# Patient Record
Sex: Female | Born: 1945 | Race: Black or African American | Hispanic: No | Marital: Married | Smoking: Never smoker
Health system: Southern US, Community
[De-identification: ages and names within clinical notes are randomized; demographics above are authoritative.]

---

## 2008-04-05 ENCOUNTER — Encounter: Admission: RE | Admit: 2008-04-05 | Discharge: 2008-04-05 | Payer: Self-pay | Admitting: Family Medicine

## 2008-04-05 ENCOUNTER — Ambulatory Visit: Payer: Self-pay | Admitting: Family Medicine

## 2008-04-05 DIAGNOSIS — E049 Nontoxic goiter, unspecified: Secondary | ICD-10-CM | POA: Insufficient documentation

## 2008-04-08 ENCOUNTER — Encounter: Payer: Self-pay | Admitting: Family Medicine

## 2008-04-11 DIAGNOSIS — E785 Hyperlipidemia, unspecified: Secondary | ICD-10-CM | POA: Insufficient documentation

## 2008-04-11 LAB — CONVERTED CEMR LAB
ALT: 10 units/L (ref 0–35)
AST: 16 units/L (ref 0–37)
Albumin: 4 g/dL (ref 3.5–5.2)
Alkaline Phosphatase: 97 units/L (ref 39–117)
BUN: 16 mg/dL (ref 6–23)
CO2: 26 meq/L (ref 19–32)
Calcium: 9.3 mg/dL (ref 8.4–10.5)
Chloride: 105 meq/L (ref 96–112)
Cholesterol, target level: 200 mg/dL
Cholesterol: 236 mg/dL — ABNORMAL HIGH (ref 0–200)
Creatinine, Ser: 1.07 mg/dL (ref 0.40–1.20)
Glucose, Bld: 98 mg/dL (ref 70–99)
HDL goal, serum: 40 mg/dL
HDL: 70 mg/dL (ref >39)
LDL Cholesterol: 152 mg/dL — ABNORMAL HIGH (ref 0–99)
LDL Goal: 160 mg/dL
Potassium: 4.5 meq/L (ref 3.5–5.3)
Sodium: 141 meq/L (ref 135–145)
TSH: 1.105 microintl units/mL (ref 0.350–4.50)
Total Bilirubin: 0.5 mg/dL (ref 0.3–1.2)
Total CHOL/HDL Ratio: 3.4
Total Protein: 7.1 g/dL (ref 6.0–8.3)
Triglycerides: 69 mg/dL (ref <150)
VLDL: 14 mg/dL (ref 0–40)

## 2008-04-12 ENCOUNTER — Ambulatory Visit: Payer: Self-pay | Admitting: Family Medicine

## 2008-04-12 DIAGNOSIS — I10 Essential (primary) hypertension: Secondary | ICD-10-CM

## 2008-05-11 ENCOUNTER — Ambulatory Visit: Payer: Self-pay | Admitting: Family Medicine

## 2008-06-21 ENCOUNTER — Ambulatory Visit: Payer: Self-pay | Admitting: Family Medicine

## 2008-06-24 ENCOUNTER — Telehealth: Payer: Self-pay | Admitting: Family Medicine

## 2008-06-24 ENCOUNTER — Ambulatory Visit: Payer: Self-pay | Admitting: Family Medicine

## 2008-06-24 DIAGNOSIS — IMO0002 Reserved for concepts with insufficient information to code with codable children: Secondary | ICD-10-CM | POA: Insufficient documentation

## 2008-08-16 ENCOUNTER — Ambulatory Visit: Payer: Self-pay | Admitting: Family Medicine

## 2008-08-16 DIAGNOSIS — J309 Allergic rhinitis, unspecified: Secondary | ICD-10-CM | POA: Insufficient documentation

## 2008-09-02 ENCOUNTER — Encounter: Payer: Self-pay | Admitting: Family Medicine

## 2008-09-05 LAB — CONVERTED CEMR LAB: Direct LDL: 151 mg/dL — ABNORMAL HIGH

## 2008-10-27 ENCOUNTER — Ambulatory Visit: Payer: Self-pay | Admitting: Family Medicine

## 2008-11-04 ENCOUNTER — Encounter: Payer: Self-pay | Admitting: Family Medicine

## 2008-11-06 LAB — CONVERTED CEMR LAB
ALT: 13 units/L (ref 0–35)
AST: 17 units/L (ref 0–37)
Albumin: 4.1 g/dL (ref 3.5–5.2)
Alkaline Phosphatase: 87 units/L (ref 39–117)
BUN: 14 mg/dL (ref 6–23)
CO2: 27 meq/L (ref 19–32)
Calcium: 9.2 mg/dL (ref 8.4–10.5)
Chloride: 108 meq/L (ref 96–112)
Cholesterol: 178 mg/dL (ref 0–200)
Creatinine, Ser: 1.16 mg/dL (ref 0.40–1.20)
Glucose, Bld: 98 mg/dL (ref 70–99)
HDL: 69 mg/dL (ref >39)
LDL Cholesterol: 97 mg/dL (ref 0–99)
Potassium: 4.4 meq/L (ref 3.5–5.3)
Sodium: 143 meq/L (ref 135–145)
Total Bilirubin: 0.5 mg/dL (ref 0.3–1.2)
Total CHOL/HDL Ratio: 2.6
Total Protein: 6.9 g/dL (ref 6.0–8.3)
Triglycerides: 58 mg/dL (ref <150)
VLDL: 12 mg/dL (ref 0–40)

## 2009-05-11 ENCOUNTER — Ambulatory Visit: Payer: Self-pay | Admitting: Family Medicine

## 2009-05-11 DIAGNOSIS — M1712 Unilateral primary osteoarthritis, left knee: Secondary | ICD-10-CM | POA: Insufficient documentation

## 2009-05-11 DIAGNOSIS — N644 Mastodynia: Secondary | ICD-10-CM

## 2009-06-02 ENCOUNTER — Encounter: Admission: RE | Admit: 2009-06-02 | Discharge: 2009-06-02 | Payer: Self-pay | Admitting: Family Medicine

## 2010-02-07 ENCOUNTER — Ambulatory Visit: Payer: Self-pay | Admitting: Family Medicine

## 2010-02-07 ENCOUNTER — Encounter: Admission: RE | Admit: 2010-02-07 | Discharge: 2010-02-07 | Payer: Self-pay | Admitting: Family Medicine

## 2010-02-08 ENCOUNTER — Encounter: Payer: Self-pay | Admitting: Family Medicine

## 2010-02-09 LAB — CONVERTED CEMR LAB
ALT: 16 units/L (ref 0–35)
AST: 19 units/L (ref 0–37)
Albumin: 4.1 g/dL (ref 3.5–5.2)
Alkaline Phosphatase: 85 units/L (ref 39–117)
BUN: 15 mg/dL (ref 6–23)
CO2: 27 meq/L (ref 19–32)
Calcium: 9.2 mg/dL (ref 8.4–10.5)
Chloride: 108 meq/L (ref 96–112)
Cholesterol: 172 mg/dL (ref 0–200)
Creatinine, Ser: 1.02 mg/dL (ref 0.40–1.20)
Glucose, Bld: 96 mg/dL (ref 70–99)
HDL: 68 mg/dL (ref >39)
LDL Cholesterol: 95 mg/dL (ref 0–99)
Potassium: 4.4 meq/L (ref 3.5–5.3)
Sodium: 142 meq/L (ref 135–145)
TSH: 0.67 microintl units/mL (ref 0.350–4.500)
Total Bilirubin: 0.6 mg/dL (ref 0.3–1.2)
Total CHOL/HDL Ratio: 2.5
Total Protein: 6.4 g/dL (ref 6.0–8.3)
Triglycerides: 43 mg/dL (ref <150)
VLDL: 9 mg/dL (ref 0–40)

## 2010-02-15 ENCOUNTER — Ambulatory Visit: Payer: Self-pay | Admitting: Family Medicine

## 2010-02-15 DIAGNOSIS — K2990 Gastroduodenitis, unspecified, without bleeding: Secondary | ICD-10-CM

## 2010-02-15 DIAGNOSIS — K2970 Gastritis, unspecified, without bleeding: Secondary | ICD-10-CM | POA: Insufficient documentation

## 2010-06-05 NOTE — Assessment & Plan Note (Signed)
Summary: LEFT BREAST & Left knee pain   Vital Signs:  Patient profile:   65 year old female Height:      64 inches Weight:      213 pounds BMI:     36.69 O2 Sat:      100 % on Room air Temp:     97.9 degrees F oral Pulse rate:   68 / minute BP sitting:   127 / 84  (left arm) Cuff size:   large  Vitals Entered By: [name] CMA (May 11, 2009 9:48 AM)  O2 Flow:  Room air CC: Pain in L breast and L knee x 2 weeks.    Primary Care Provider:  [name]  CC:  Pain in L breast and L knee x 2 weeks. Rhonda Sampson  History of Present Illness: Pain in L breast and L knee x 2 weeks. Pain is sharp and tender over the nipple.  No lumps or bumps. Mammogram was last year. It was normal. Bra aggrevates the area. Pain intermittant.  No periods. No medicine or heating pad.  No alleviaiting sxs.   Left knee with occ sharp along the medial side of the knee.  Pain for one month in this area.  Comes and goes.  No swelling.  Using icey hot. Worse when up on it alot.  NO fever.  No medicine for this either.    Allergies: No Known Drug Allergies  Physical Exam  General:  Well-developed,well-nourished,in no acute distress; alert,appropriate and cooperative throughout examination Breasts:  Left breast appears slightly larger than the right but no edema, skin puckering or paeu d'orange. ON palpation no lesions or masses or tenderness.   Msk:  Left knee with some crepitus. trace edema.  NROM.  Normal flexion and extension. No laxity of the joint. She is tender on the inner part of her knee below hte patella. Knee, ankle strength 5/5. Skin:  no rashes.   Psych:  Cognition and judgment appear intact. Alert and cooperative with normal attention span and concentration. No apparent delusions, illusions, hallucinations   Impression & Recommendations:  Problem # 1:  BREAST PAIN (ICD-611.71) Exam is normal which is reassuraing but she overdue for her mammogram. She has been wearing a good supportive  bra.   Will schedule for dianostic mammogram.   Orders: T-Mammography, Diagnostic (bilateral) (09811)  Problem # 2:  KNEE PAIN (ICD-719.46) Suspect pes anserine bursitis based on exam. Discussed icing, NSAIDs (if tolerates), knee sleeve and gave a H.O. on exercises. If not improving consider PT or injection for pain relief.    Complete Medication List: 1)  Amlodipine Besylate 10 Mg Tabs (Amlodipine besylate) .... Take 1 tablet by mouth once a day 2)  Pravachol 40 Mg Tabs (Pravastatin sodium) .... Take 1 tablet by mouth once a day 3)  Benazepril Hcl 40 Mg Tabs (Benazepril hcl) .... Take 1 tablet by mouth once a day

## 2010-06-05 NOTE — Assessment & Plan Note (Signed)
Summary: KNee pain, HTN   Vital Signs:  Patient profile:   65 year old female Height:      64 inches Weight:      217 pounds BMI:     37.38 Pulse rate:   76 / minute BP sitting:   117 / 64  (left arm) Cuff size:   large  Vitals Entered By: Avon Gully CMA, (AAMA) (February 07, 2010 10:14 AM) CC: left knee pain x 2-3 weeks doesnt remember injuring it   Primary Care Charvi Gammage:  [name]  CC:  left knee pain x 2-3 weeks doesnt remember injuring it.  History of Present Illness: left knee pain x 2-3 weeks doesnt remember injuring it.  Had similar injury about 2 years ago. Pain radiates down into her shin.  Walking irritates it. No locking or giving out. + swelling. Tried some Aleve and tylenol but didnt' really help. Better with rest, and elevated. Doesn't ice it.  Does sit in hot baths.    Current Medications (verified): 1)  Amlodipine Besylate 10 Mg Tabs (Amlodipine Besylate) .... Take 1 Tablet By Mouth Once A Day 2)  Pravachol 40 Mg Tabs (Pravastatin Sodium) .... Take 1 Tablet By Mouth Once A Day 3)  Benazepril Hcl 40 Mg Tabs (Benazepril Hcl) .... Take 1 Tablet By Mouth Once A Day  Allergies (verified): No Known Drug Allergies  Comments:  Nurse/Medical Assistant: The patient's medications and allergies were reviewed with the patient and were updated in the Medication and Allergy Lists. Avon Gully CMA, Duncan Dull) (February 07, 2010 10:15 AM)  Physical Exam  General:  Well-developed,well-nourished,in no acute distress; alert,appropriate and cooperative throughout examination Head:  Normocephalic and atraumatic without obvious abnormalities. No apparent alopecia or balding. Lungs:  Normal respiratory effort, chest expands symmetrically. Lungs are clear to auscultation, no crackles or wheezes. Heart:  Normal rate and regular rhythm. S1 and S2 normal without gallop, murmur, click, rub or other extra sounds. Msk:  Left knee with trace edema. seh is tender along  the medial and lateral joint lines.  Pain with full flexion. Neg McMurrays.  No laxity of the joint.  strength 5/5in the kneeand ankle.  Skin:  no rashes.   Psych:  Cognition and judgment appear intact. Alert and cooperative with normal attention span and concentration. No apparent delusions, illusions, hallucinations   Impression & Recommendations:  Problem # 1:  KNEE PAIN (ICD-719.46) Suspect OA flare or possibly a cartilage tear. This is her 3rd episode. Will start NSAID two times a day Recommend ice instead of heat, elevation, and compression .Can get knee sleeve at the pharmacy Will get xray to rule out significant OA or loose body. If neg consider knee injection if not getting better.  Orders: T-DG Knee 3 Views L (09811.6)  Her updated medication list for this problem includes:    Naprosyn 500 Mg Tabs (Naproxen) .Marland Kitchen... Take 1 tablet by mouth two times a day for knee pain  Problem # 2:  ESSENTIAL HYPERTENSION, BENIGN (ICD-401.1) Looks great today. Overdue for labs. Slip given today.  Her updated medication list for this problem includes:    Amlodipine Besylate 10 Mg Tabs (Amlodipine besylate) .Marland Kitchen... Take 1 tablet by mouth once a day    Benazepril Hcl 40 Mg Tabs (Benazepril hcl) .Marland Kitchen... Take 1 tablet by mouth once a day  BP today: 117/64 Prior BP: 127/84 (05/11/2009)  Prior 10 Yr Risk Heart Disease: 7 % (06/21/2008)  Labs Reviewed: K+: 4.4 (11/04/2008) Creat: : 1.16 (11/04/2008)  Chol: 178 (11/04/2008)   HDL: 69 (11/04/2008)   LDL: 97 (11/04/2008)   TG: 58 (11/04/2008)  Orders: T-Comprehensive Metabolic Panel (69629-52841) T-Lipid Profile (32440-10272)  Complete Medication List: 1)  Amlodipine Besylate 10 Mg Tabs (Amlodipine besylate) .... Take 1 tablet by mouth once a day 2)  Pravachol 40 Mg Tabs (Pravastatin sodium) .... Take 1 tablet by mouth once a day 3)  Benazepril Hcl 40 Mg Tabs (Benazepril hcl) .... Take 1 tablet by mouth once a day 4)  Naprosyn 500 Mg Tabs (Naproxen)  .... Take 1 tablet by mouth two times a day for knee pain  Other Orders: T-TSH ([id number])  Contraindications/Deferment of Procedures/Staging:    Test/Procedure: FLU VAX    Reason for deferment: patient declined   Patient Instructions: 1)  Wear knee sleeve for compression and support 2)  Ice the knee two times a day  3)  We will call with the xray results.  4)  Take the naproxen two times a day with food and water. Stop if any stomach irriation.  Prescriptions: NAPROSYN 500 MG TABS (NAPROXEN) Take 1 tablet by mouth two times a day for knee pain  #60 x 0   Entered and Authorized by:   [name]   Signed by:   [name] on 02/07/2010   Method used:   Electronically to        [facility]* (retail)       [address] [place], [place]  [address]       Ph: [phone]       Fax: [phone]   RxID:   [id number]

## 2010-06-05 NOTE — Assessment & Plan Note (Signed)
Summary: Gastritis, knee pain   Vital Signs:  Patient profile:   65 year old female Height:      64 inches Weight:      216 pounds Pulse rate:   137 / minute BP sitting:   117 / 66  (right arm) Cuff size:   large  Vitals Entered By: Avon Gully CMA, Duncan Dull) (February 15, 2010 11:31 AM) CC: abdominal pain,back pain, pt was told years ago that she had an ulcer and feels the naposyn made it flare up, knee still hurts but not as swollen   Primary Care Guthrie Lemme:  [name]  CC:  abdominal pain, back pain, pt was told years ago that she had an ulcer and feels the naposyn made it flare up, and knee still hurts but not as swollen.  History of Present Illness: Started getting sharp pain the epigastrum radiating into her back.  Started about a week ago when started the naprosyn.  Stopped the med after the naproxen.  No nausea. No fever or chagne in bowels.  Pain is sharp and intermittant. No blood in the stool or vomiting.  Says has started eating a bland diet nad did try maalox which helped some.  Of note, says years ago told she had an ulcer.  No worsenign sxs.    Current Medications (verified): 1)  Amlodipine Besylate 10 Mg Tabs (Amlodipine Besylate) .... Take 1 Tablet By Mouth Once A Day 2)  Pravachol 40 Mg Tabs (Pravastatin Sodium) .... Take 1 Tablet By Mouth Once A Day 3)  Benazepril Hcl 40 Mg Tabs (Benazepril Hcl) .... Take 1 Tablet By Mouth Once A Day  Allergies (verified): 1)  Nsaids  Comments:  Nurse/Medical Assistant: The patient's medications and allergies were reviewed with the patient and were updated in the Medication and Allergy Lists. Avon Gully CMA, Duncan Dull) (February 15, 2010 11:33 AM)  Past History:  Past Medical History: HTN , 12-09 GI Ulcer  Physical Exam  General:  Well-developed,well-nourished,in no acute distress; alert,appropriate and cooperative throughout examination Lungs:  Normal respiratory effort, chest expands symmetrically. Lungs  are clear to auscultation, no crackles or wheezes. Heart:  Normal rate and regular rhythm. S1 and S2 normal without gallop, murmur, click, rub or other extra sounds. Abdomen:  soft, normal bowel sounds, no distention, no masses, no hepatomegaly, and no splenomegaly.  Mildy tender in the epigastrum.  Skin:  no rashes.   Psych:  Cognition and judgment appear intact. Alert and cooperative with normal attention span and concentration. No apparent delusions, illusions, hallucinations   Impression & Recommendations:  Problem # 1:  GASTRITIS (ICD-535.50) Secondary to NSAID. She has already stopped the med.  Continue her bland diet and start a PPI. Samples of Aciphex given for 9 days. If sxs not better in 3 days call the office. If tolerates well can change to omeprazole.  Call if any worsening sxs or blood in the stool or vomits.    Problem # 2:  KNEE PAIN (ICD-719.46) Will change to volteran gel since unable to tolerate NSAIDs. Overall her knee is feelnig better.  The following medications were removed from the medication list:    Naprosyn 500 Mg Tabs (Naproxen) .Marland Kitchen... Take 1 tablet by mouth two times a day for knee pain  Complete Medication List: 1)  Amlodipine Besylate 10 Mg Tabs (Amlodipine besylate) .... Take 1 tablet by mouth once a day 2)  Pravachol 40 Mg Tabs (Pravastatin sodium) .... Take 1 tablet by mouth once a day 3)  Benazepril Hcl 40 Mg Tabs (Benazepril hcl) .... Take 1 tablet by mouth once a day 4)  Voltaren 1 % Gel (Diclofenac sodium) .... Apply 3-4 times a day to the affected joint.  Patient Instructions: 1)  Take the Aciphex once a day about 20 minutes before breakfast.  2)  Call if not helping  3)  If does help wil send a prescription for omeprazole.   Contraindications/Deferment of Procedures/Staging:    Test/Procedure: FLU VAX    Reason for deferment: patient declined  Prescriptions: VOLTAREN 1 % GEL (DICLOFENAC SODIUM) Apply 3-4 times a day to the affected joint.  #1  tube x 1   Entered and Authorized by:   [name]   Signed by:   [name] on 02/15/2010   Method used:   Electronically to        [facility]* (retail)       [address] [place], [place]  [address]       Ph: [phone]       Fax: [phone]   RxID:   [id number]

## 2011-04-17 ENCOUNTER — Ambulatory Visit (INDEPENDENT_AMBULATORY_CARE_PROVIDER_SITE_OTHER): Payer: Medicare Other | Admitting: Family Medicine

## 2011-04-17 DIAGNOSIS — E785 Hyperlipidemia, unspecified: Secondary | ICD-10-CM

## 2011-04-17 DIAGNOSIS — E010 Iodine-deficiency related diffuse (endemic) goiter: Secondary | ICD-10-CM

## 2011-04-17 DIAGNOSIS — M25569 Pain in unspecified knee: Secondary | ICD-10-CM

## 2011-04-17 DIAGNOSIS — E049 Nontoxic goiter, unspecified: Secondary | ICD-10-CM

## 2011-04-17 DIAGNOSIS — I10 Essential (primary) hypertension: Secondary | ICD-10-CM

## 2011-04-17 DIAGNOSIS — Z23 Encounter for immunization: Secondary | ICD-10-CM

## 2011-04-17 DIAGNOSIS — M25562 Pain in left knee: Secondary | ICD-10-CM

## 2011-04-17 MED ORDER — PRAVASTATIN SODIUM 40 MG PO TABS: 40.0000 mg | ORAL_TABLET | Freq: Every day | ORAL | Status: DC

## 2011-04-17 MED ORDER — AMLODIPINE BESYLATE 10 MG PO TABS: 10.0000 mg | ORAL_TABLET | Freq: Every day | ORAL | Status: DC

## 2011-04-17 MED ORDER — BENAZEPRIL HCL 40 MG PO TABS: 40.0000 mg | ORAL_TABLET | Freq: Every day | ORAL | Status: DC

## 2011-04-17 NOTE — Progress Notes (Signed)
  Subjective:    Patient ID: Rhonda Sampson, female    DOB: 11-Jul-1945, 65 y.o.   MRN: [id number]  Hypertension This is a chronic problem. The current episode started more than 1 year ago. The problem is unchanged. The problem is controlled. Pertinent negatives include no blurred vision, headaches, palpitations or shortness of breath. Risk factors for coronary artery disease include obesity. Past treatments include calcium channel blockers and ACE inhibitors. The current treatment provides mild improvement. Compliance problems include exercise.     Review of Systems  Eyes: Negative for blurred vision.  Respiratory: Negative for shortness of breath.   Cardiovascular: Negative for palpitations.  Neurological: Negative for headaches.       Objective:   Physical Exam  Constitutional: She is oriented to person, place, and time. She appears well-developed and well-nourished.  HENT:  Head: Normocephalic and atraumatic.  Cardiovascular: Normal rate, regular rhythm and normal heart sounds.   Pulmonary/Chest: Effort normal and breath sounds normal.  Musculoskeletal:       2+ LE edema.   Neurological: She is alert and oriented to person, place, and time.  Skin: Skin is warm and dry.  Psychiatric: She has a normal mood and affect. Her behavior is normal.          Assessment & Plan:  HTN- looks great today. Can try cutting her amlodipine in half and see if LE swelling improves thought this is likely chronic. If not difference after 2 weeks then consider going back to whole tab.  Thyromegaly - Recheck TSH.  Hyperlipidemia- Doing well on statin. Recheck lipoids and liver.   Given pnemovac and Tdap today.

## 2011-04-17 NOTE — Patient Instructions (Addendum)
Can cut amlodipine in half and see if after 2 weeks the swelling in your legs is better.  We will call you with your lab results. If you don't here from [place] in about a week then please give [place] a call at [phone].

## 2011-04-18 LAB — COMPLETE METABOLIC PANEL WITH GFR
ALT: 12 U/L (ref 0–35)
AST: 18 U/L (ref 0–37)
Albumin: 4.2 g/dL (ref 3.5–5.2)
Alkaline Phosphatase: 87 U/L (ref 39–117)
BUN: 17 mg/dL (ref 6–23)
CO2: 27 mEq/L (ref 19–32)
Calcium: 9.2 mg/dL (ref 8.4–10.5)
Chloride: 106 mEq/L (ref 96–112)
Creat: 0.97 mg/dL (ref 0.50–1.10)
GFR, Est African American: 70 mL/min
GFR, Est Non African American: 61 mL/min
Glucose, Bld: 87 mg/dL (ref 70–99)
Potassium: 4.3 mEq/L (ref 3.5–5.3)
Sodium: 141 mEq/L (ref 135–145)
Total Bilirubin: 0.5 mg/dL (ref 0.3–1.2)
Total Protein: 6.5 g/dL (ref 6.0–8.3)

## 2011-04-18 LAB — LIPID PANEL
Cholesterol: 174 mg/dL (ref 0–200)
HDL: 72 mg/dL (ref >39)
LDL Cholesterol: 93 mg/dL (ref 0–99)
Total CHOL/HDL Ratio: 2.4 Ratio
Triglycerides: 44 mg/dL (ref <150)
VLDL: 9 mg/dL (ref 0–40)

## 2011-04-18 LAB — TSH: TSH: 0.759 u[IU]/mL (ref 0.350–4.500)

## 2011-05-10 ENCOUNTER — Other Ambulatory Visit: Payer: Self-pay | Admitting: *Deleted

## 2011-05-10 MED ORDER — AMLODIPINE BESYLATE 10 MG PO TABS: 10.0000 mg | ORAL_TABLET | Freq: Every day | ORAL | Status: DC

## 2011-05-10 MED ORDER — PRAVASTATIN SODIUM 40 MG PO TABS: 40.0000 mg | ORAL_TABLET | Freq: Every day | ORAL | Status: DC

## 2011-05-10 MED ORDER — BENAZEPRIL HCL 40 MG PO TABS: 40.0000 mg | ORAL_TABLET | Freq: Every day | ORAL | Status: DC

## 2011-08-07 ENCOUNTER — Encounter: Payer: Self-pay | Admitting: Physician Assistant

## 2011-08-07 ENCOUNTER — Ambulatory Visit (INDEPENDENT_AMBULATORY_CARE_PROVIDER_SITE_OTHER): Payer: Medicare Other | Admitting: Physician Assistant

## 2011-08-07 VITALS — BP 123/77 | HR 79 | Ht 64.0 in | Wt 218.0 lb

## 2011-08-07 DIAGNOSIS — IMO0002 Reserved for concepts with insufficient information to code with codable children: Secondary | ICD-10-CM

## 2011-08-07 MED ORDER — CYCLOBENZAPRINE HCL 10 MG PO TABS: 10.0000 mg | ORAL_TABLET | Freq: Every evening | ORAL | Status: AC | PRN

## 2011-08-07 NOTE — Patient Instructions (Addendum)
Tylenol 1000mg  up to three times a day alternate with aleve up to twice a day. Also add Flexeril 10mg  1/2 at bedtime. Try ice pack on low back for 15 to and can repeat as much as needed for pain relief. Do stretches daily as tolerated and remember good lifting habits. Consider physical therapy if not improving in next week or so.    Low Back Strain with Rehab A strain is an injury in which a tendon or muscle is torn. The muscles and tendons of the lower back are vulnerable to strains. However, these muscles and tendons are very strong and require a great force to be injured. Strains are classified into three categories. Grade 1 strains cause pain, but the tendon is not lengthened. Grade 2 strains include a lengthened ligament, due to the ligament being stretched or partially ruptured. With grade 2 strains there is still function, although the function may be decreased. Grade 3 strains involve a complete tear of the tendon or muscle, and function is usually impaired. SYMPTOMS   Pain in the lower back.   Pain that affects one side more than the other.   Pain that gets worse with movement and may be felt in the hip, buttocks, or back of the thigh.   Muscle spasms of the muscles in the back.   Swelling along the muscles of the back.   Loss of strength of the back muscles.   Crackling sound (crepitation) when the muscles are touched.  CAUSES  Lower back strains occur when a force is placed on the muscles or tendons that is greater than they can handle. Common causes of injury include:  Prolonged overuse of the muscle-tendon units in the lower back, usually from incorrect posture.   A single violent injury or force applied to the back.  RISK INCREASES WITH:  Sports that involve twisting forces on the spine or a lot of bending at the waist (football, rugby, weightlifting, bowling, golf, tennis, speed skating, racquetball, swimming, running, gymnastics, diving).   Poor strength and  flexibility.   Failure to warm up properly before activity.   Family history of lower back pain or disk disorders.   Previous back injury or surgery (especially fusion).   Poor posture with lifting, especially heavy objects.   Prolonged sitting, especially with poor posture.  PREVENTION   Learn and use proper posture when sitting or lifting (maintain proper posture when sitting, lift using the knees and legs, not at the waist).   Warm up and stretch properly before activity.   Allow for adequate recovery between workouts.   Maintain physical fitness:   Strength, flexibility, and endurance.   Cardiovascular fitness.  PROGNOSIS  If treated properly, lower back strains usually heal within 6 weeks. RELATED COMPLICATIONS   Recurring symptoms, resulting in a chronic problem.   Chronic inflammation, scarring, and partial muscle-tendon tear.   Delayed healing or resolution of symptoms.   Prolonged disability.  TREATMENT  Treatment first involves the use of ice and medicine, to reduce pain and inflammation. The use of strengthening and stretching exercises may help reduce pain with activity. These exercises may be performed at home or with a therapist. Severe injuries may require referral to a therapist for further evaluation and treatment, such as ultrasound. Your caregiver may advise that you wear a back brace or corset, to help reduce pain and discomfort. Often, prolonged bed rest results in greater harm then benefit. Corticosteroid injections may be recommended. However, these should be reserved for  the most serious cases. It is important to avoid using your back when lifting objects. At night, sleep on your back on a firm mattress with a pillow placed under your knees. If non-surgical treatment is unsuccessful, surgery may be needed.  MEDICATION   If pain medicine is needed, nonsteroidal anti-inflammatory medicines (aspirin and ibuprofen), or other minor pain relievers  (acetaminophen), are often advised.   Do not take pain medicine for 7 days before surgery.   Prescription pain relievers may be given, if your caregiver thinks they are needed. Use only as directed and only as much as you need.   Ointments applied to the skin may be helpful.   Corticosteroid injections may be given by your caregiver. These injections should be reserved for the most serious cases, because they may only be given a certain number of times.  HEAT AND COLD  Cold treatment (icing) should be applied for 10 to 15 minutes every 2 to 3 hours for inflammation and pain, and immediately after activity that aggravates your symptoms. Use ice packs or an ice massage.   Heat treatment may be used before performing stretching and strengthening activities prescribed by your caregiver, physical therapist, or athletic trainer. Use a heat pack or a warm water soak.  SEEK MEDICAL CARE IF:   Symptoms get worse or do not improve in 2 to 4 weeks, despite treatment.   You develop numbness, weakness, or loss of bowel or bladder function.   New, unexplained symptoms develop. (Drugs used in treatment may produce side effects.)  EXERCISES  RANGE OF MOTION (ROM) AND STRETCHING EXERCISES - Low Back Strain Most people with lower back pain will find that their symptoms get worse with excessive bending forward (flexion) or arching at the lower back (extension). The exercises which will help resolve your symptoms will focus on the opposite motion.  Your physician, physical therapist or athletic trainer will help you determine which exercises will be most helpful to resolve your lower back pain. Do not complete any exercises without first consulting with your caregiver. Discontinue any exercises which make your symptoms worse until you speak to your caregiver.  If you have pain, numbness or tingling which travels down into your buttocks, leg or foot, the goal of the therapy is for these symptoms to move closer  to your back and eventually resolve. Sometimes, these leg symptoms will get better, but your lower back pain may worsen. This is typically an indication of progress in your rehabilitation. Be very alert to any changes in your symptoms and the activities in which you participated in the 24 hours prior to the change. Sharing this information with your caregiver will allow him/her to most efficiently treat your condition.  These exercises may help you when beginning to rehabilitate your injury. Your symptoms may resolve with or without further involvement from your physician, physical therapist or athletic trainer. While completing these exercises, remember:   Restoring tissue flexibility helps normal motion to return to the joints. This allows healthier, less painful movement and activity.   An effective stretch should be held for at least 30 seconds.   A stretch should never be painful. You should only feel a gentle lengthening or release in the stretched tissue.  FLEXION RANGE OF MOTION AND STRETCHING EXERCISES: STRETCH - Flexion, Single Knee to Chest   Lie on a firm bed or floor with both legs extended in front of you.   Keeping one leg in contact with the floor, bring your opposite knee  to your chest. Hold your leg in place by either grabbing behind your thigh or at your knee.   Pull until you feel a gentle stretch in your lower back. Hold __________ seconds.   Slowly release your grasp and repeat the exercise with the opposite side.  Repeat __________ times. Complete this exercise __________ times per day.  STRETCH - Flexion, Double Knee to Chest   Lie on a firm bed or floor with both legs extended in front of you.   Keeping one leg in contact with the floor, bring your opposite knee to your chest.   Tense your stomach muscles to support your back and then lift your other knee to your chest. Hold your legs in place by either grabbing behind your thighs or at your knees.   Pull both  knees toward your chest until you feel a gentle stretch in your lower back. Hold __________ seconds.   Tense your stomach muscles and slowly return one leg at a time to the floor.  Repeat __________ times. Complete this exercise __________ times per day.  STRETCH - Low Trunk Rotation  Lie on a firm bed or floor. Keeping your legs in front of you, bend your knees so they are both pointed toward the ceiling and your feet are flat on the floor.   Extend your arms out to the side. This will stabilize your upper body by keeping your shoulders in contact with the floor.   Gently and slowly drop both knees together to one side until you feel a gentle stretch in your lower back. Hold for __________ seconds.   Tense your stomach muscles to support your lower back as you bring your knees back to the starting position. Repeat the exercise to the other side.  Repeat __________ times. Complete this exercise __________ times per day  EXTENSION RANGE OF MOTION AND FLEXIBILITY EXERCISES: STRETCH - Extension, Prone on Elbows   Lie on your stomach on the floor, a bed will be too soft. Place your palms about shoulder width apart and at the height of your head.   Place your elbows under your shoulders. If this is too painful, stack pillows under your chest.   Allow your body to relax so that your hips drop lower and make contact more completely with the floor.   Hold this position for __________ seconds.   Slowly return to lying flat on the floor.  Repeat __________ times. Complete this exercise __________ times per day.  RANGE OF MOTION - Extension, Prone Press Ups  Lie on your stomach on the floor, a bed will be too soft. Place your palms about shoulder width apart and at the height of your head.   Keeping your back as relaxed as possible, slowly straighten your elbows while keeping your hips on the floor. You may adjust the placement of your hands to maximize your comfort. As you gain motion, your  hands will come more underneath your shoulders.   Hold this position __________ seconds.   Slowly return to lying flat on the floor.  Repeat __________ times. Complete this exercise __________ times per day.  RANGE OF MOTION- Quadruped, Neutral Spine   Assume a hands and knees position on a firm surface. Keep your hands under your shoulders and your knees under your hips. You may place padding under your knees for comfort.   Drop your head and point your tail bone toward the ground below you. This will round out your lower back like an angry  cat. Hold this position for __________ seconds.   Slowly lift your head and release your tail bone so that your back sags into a large arch, like an old horse.   Hold this position for __________ seconds.   Repeat this until you feel limber in your lower back.   Now, find your "sweet spot." This will be the most comfortable position somewhere between the two previous positions. This is your neutral spine. Once you have found this position, tense your stomach muscles to support your lower back.   Hold this position for __________ seconds.  Repeat __________ times. Complete this exercise __________ times per day.  STRENGTHENING EXERCISES - Low Back Strain These exercises may help you when beginning to rehabilitate your injury. These exercises should be done near your "sweet spot." This is the neutral, low-back arch, somewhere between fully rounded and fully arched, that is your least painful position. When performed in this safe range of motion, these exercises can be used for people who have either a flexion or extension based injury. These exercises may resolve your symptoms with or without further involvement from your physician, physical therapist or athletic trainer. While completing these exercises, remember:   Muscles can gain both the endurance and the strength needed for everyday activities through controlled exercises.   Complete these  exercises as instructed by your physician, physical therapist or athletic trainer. Increase the resistance and repetitions only as guided.   You may experience muscle soreness or fatigue, but the pain or discomfort you are trying to eliminate should never worsen during these exercises. If this pain does worsen, stop and make certain you are following the directions exactly. If the pain is still present after adjustments, discontinue the exercise until you can discuss the trouble with your caregiver.  STRENGTHENING - Deep Abdominals, Pelvic Tilt  Lie on a firm bed or floor. Keeping your legs in front of you, bend your knees so they are both pointed toward the ceiling and your feet are flat on the floor.   Tense your lower abdominal muscles to press your lower back into the floor. This motion will rotate your pelvis so that your tail bone is scooping upwards rather than pointing at your feet or into the floor.   With a gentle tension and even breathing, hold this position for __________ seconds.  Repeat __________ times. Complete this exercise __________ times per day.  STRENGTHENING - Abdominals, Crunches   Lie on a firm bed or floor. Keeping your legs in front of you, bend your knees so they are both pointed toward the ceiling and your feet are flat on the floor. Cross your arms over your chest.   Slightly tip your chin down without bending your neck.   Tense your abdominals and slowly lift your trunk high enough to just clear your shoulder blades. Lifting higher can put excessive stress on the lower back and does not further strengthen your abdominal muscles.   Control your return to the starting position.  Repeat __________ times. Complete this exercise __________ times per day.  STRENGTHENING - Quadruped, Opposite UE/LE Lift   Assume a hands and knees position on a firm surface. Keep your hands under your shoulders and your knees under your hips. You may place padding under your knees for  comfort.   Find your neutral spine and gently tense your abdominal muscles so that you can maintain this position. Your shoulders and hips should form a rectangle that is parallel with the floor and  is not twisted.   Keeping your trunk steady, lift your right hand no higher than your shoulder and then your left leg no higher than your hip. Make sure you are not holding your breath. Hold this position __________ seconds.   Continuing to keep your abdominal muscles tense and your back steady, slowly return to your starting position. Repeat with the opposite arm and leg.  Repeat __________ times. Complete this exercise __________ times per day.  STRENGTHENING - Lower Abdominals, Double Knee Lift  Lie on a firm bed or floor. Keeping your legs in front of you, bend your knees so they are both pointed toward the ceiling and your feet are flat on the floor.   Tense your abdominal muscles to brace your lower back and slowly lift both of your knees until they come over your hips. Be certain not to hold your breath.   Hold __________ seconds. Using your abdominal muscles, return to the starting position in a slow and controlled manner.  Repeat __________ times. Complete this exercise __________ times per day.  POSTURE AND BODY MECHANICS CONSIDERATIONS - Low Back Strain Keeping correct posture when sitting, standing or completing your activities will reduce the stress put on different body tissues, allowing injured tissues a chance to heal and limiting painful experiences. The following are general guidelines for improved posture. Your physician or physical therapist will provide you with any instructions specific to your needs. While reading these guidelines, remember:  The exercises prescribed by your provider will help you have the flexibility and strength to maintain correct postures.   The correct posture provides the best environment for your joints to work. All of your joints have less wear and tear  when properly supported by a spine with good posture. This means you will experience a healthier, less painful body.   Correct posture must be practiced with all of your activities, especially prolonged sitting and standing. Correct posture is as important when doing repetitive low-stress activities (typing) as it is when doing a single heavy-load activity (lifting).  RESTING POSITIONS Consider which positions are most painful for you when choosing a resting position. If you have pain with flexion-based activities (sitting, bending, stooping, squatting), choose a position that allows you to rest in a less flexed posture. You would want to avoid curling into a fetal position on your side. If your pain worsens with extension-based activities (prolonged standing, working overhead), avoid resting in an extended position such as sleeping on your stomach. Most people will find more comfort when they rest with their spine in a more neutral position, neither too rounded nor too arched. Lying on a non-sagging bed on your side with a pillow between your knees, or on your back with a pillow under your knees will often provide some relief. Keep in mind, being in any one position for a prolonged period of time, no matter how correct your posture, can still lead to stiffness. PROPER SITTING POSTURE In order to minimize stress and discomfort on your spine, you must sit with correct posture. Sitting with good posture should be effortless for a healthy body. Returning to good posture is a gradual process. Many people can work toward this most comfortably by using various supports until they have the flexibility and strength to maintain this posture on their own. When sitting with proper posture, your ears will fall over your shoulders and your shoulders will fall over your hips. You should use the back of the chair to support your upper back.  Your lower back will be in a neutral position, just slightly arched. You may place a  small pillow or folded towel at the base of your lower back for support.  When working at a desk, create an environment that supports good, upright posture. Without extra support, muscles tire, which leads to excessive strain on joints and other tissues. Keep these recommendations in mind: CHAIR:  A chair should be able to slide under your desk when your back makes contact with the back of the chair. This allows you to work closely.   The chair's height should allow your eyes to be level with the upper part of your monitor and your hands to be slightly lower than your elbows.  BODY POSITION  Your feet should make contact with the floor. If this is not possible, use a foot rest.   Keep your ears over your shoulders. This will reduce stress on your neck and lower back.  INCORRECT SITTING POSTURES  If you are feeling tired and unable to assume a healthy sitting posture, do not slouch or slump. This puts excessive strain on your back tissues, causing more damage and pain. Healthier options include:  Using more support, like a lumbar pillow.   Switching tasks to something that requires you to be upright or walking.   Talking a brief walk.   Lying down to rest in a neutral-spine position.  PROLONGED STANDING WHILE SLIGHTLY LEANING FORWARD  When completing a task that requires you to lean forward while standing in one place for a long time, place either foot up on a stationary 2-4 inch high object to help maintain the best posture. When both feet are on the ground, the lower back tends to lose its slight inward curve. If this curve flattens (or becomes too large), then the back and your other joints will experience too much stress, tire more quickly, and can cause pain. CORRECT STANDING POSTURES Proper standing posture should be assumed with all daily activities, even if they only take a few moments, like when brushing your teeth. As in sitting, your ears should fall over your shoulders and your  shoulders should fall over your hips. You should keep a slight tension in your abdominal muscles to brace your spine. Your tailbone should point down to the ground, not behind your body, resulting in an over-extended swayback posture.  INCORRECT STANDING POSTURES  Common incorrect standing postures include a forward head, locked knees and/or an excessive swayback. WALKING Walk with an upright posture. Your ears, shoulders and hips should all line-up. PROLONGED ACTIVITY IN A FLEXED POSITION When completing a task that requires you to bend forward at your waist or lean over a low surface, try to find a way to stabilize 3 out of 4 of your limbs. You can place a hand or elbow on your thigh or rest a knee on the surface you are reaching across. This will provide you more stability so that your muscles do not fatigue as quickly. By keeping your knees relaxed, or slightly bent, you will also reduce stress across your lower back. CORRECT LIFTING TECHNIQUES DO :   Assume a wide stance. This will provide you more stability and the opportunity to get as close as possible to the object which you are lifting.   Tense your abdominals to brace your spine. Bend at the knees and hips. Keeping your back locked in a neutral-spine position, lift using your leg muscles. Lift with your legs, keeping your back straight.  Test the weight of unknown objects before attempting to lift them.   Try to keep your elbows locked down at your sides in order get the best strength from your shoulders when carrying an object.   Always ask for help when lifting heavy or awkward objects.  INCORRECT LIFTING TECHNIQUES DO NOT:   Lock your knees when lifting, even if it is a small object.   Bend and twist. Pivot at your feet or move your feet when needing to change directions.   Assume that you can safely pick up even a paper clip without proper posture.  Document Released: 04/22/2005 Document Revised: 04/11/2011 Document  Reviewed: 08/04/2008 [facility] Patient Information 2012 [place], [place].

## 2011-08-07 NOTE — Progress Notes (Signed)
  Subjective:    Patient ID: Rhonda Sampson, female    DOB: 1945-12-11, 65 y.o.   MRN: [id number]  HPI Patient presents to the clinic with an episode of low back pain. She has a history of lumbar back pain with radiculopathy but her last epidsoe was in 2010. The pain started Friday which was 5 days ago when she got up quickly from bed and she felt a pull in her back. The pain is a 9/10 and radiates down left side to knee and sometimes all the way to ankle. It is hard to get up because of the pain. She took some old flexeril that she was given but it has not helped. The pain is better when leaning forward and sitting. She has never had any imaging. She denies any bowel or urinary incontinence.      Review of Systems     Objective:   Physical Exam  Constitutional: She appears well-developed and well-nourished.       Obese  HENT:  Head: Normocephalic and atraumatic.  Cardiovascular: Normal rate, regular rhythm and normal heart sounds.   Pulmonary/Chest: Effort normal and breath sounds normal. She has no wheezes.  Musculoskeletal:       Active ROM limited with forward flexion, backward extension and side to side bending. Patient had pain standing up from chair but able to do so without help. Positive straight leg test on left leg with pain all the way to ankle. DTR's intact. STrenth 5/5 for both legs. No tenderness over spine. Mild tenderness over left lower paraspinus muscles.          Assessment & Plan:  Back pain with radiculopathy- Tylenol 1000mg  up to three times a day alternate with aleve up to twice a day. Since she has reported some sensitivity to NSAIDs but denies any problems with Aleve twice a day. Also add Flexeril 10mg  1/2 at bedtime. Try ice pack on low back for 15 to and can repeat as much as needed for pain relief. Do stretches daily as tolerated and remember good lifting habits. Consider physical therapy if not improving in next week or so.

## 2012-01-22 ENCOUNTER — Ambulatory Visit (INDEPENDENT_AMBULATORY_CARE_PROVIDER_SITE_OTHER): Payer: Medicare Other | Admitting: Family Medicine

## 2012-01-22 ENCOUNTER — Encounter: Payer: Self-pay | Admitting: Family Medicine

## 2012-01-22 VITALS — BP 127/83 | HR 76 | Wt 213.0 lb

## 2012-01-22 DIAGNOSIS — N644 Mastodynia: Secondary | ICD-10-CM

## 2012-01-22 DIAGNOSIS — M25519 Pain in unspecified shoulder: Secondary | ICD-10-CM

## 2012-01-22 MED ORDER — MELOXICAM 15 MG PO TABS: 15.0000 mg | ORAL_TABLET | Freq: Every day | ORAL | Status: DC

## 2012-01-22 NOTE — Progress Notes (Signed)
  Subjective:    Patient ID: Rhonda Sampson, female    DOB: 10-11-1945, 66 y.o.   MRN: [id number]  HPI Breast pain for one month. No trauma. No soreness. Pain comes and goes.  No changes in diet.  No prior hx of breat cancer.  Last mammo in 1/ 2011.  Pain is slightly a throb.  No lumps or bumps.  Says she wearing a supportive bra. No family history of BrCa.  Right shoulder pain - hard to reach back behind her back. Says painful to sleep on that side.  Pain on top of the shoulder. X 2 months.  No meds for relief.  No icin.    Review of Systems     Objective:   Physical Exam  Constitutional: She is oriented to person, place, and time. She appears well-developed and well-nourished.  HENT:  Head: Normocephalic and atraumatic.  Cardiovascular: Normal rate, regular rhythm and normal heart sounds.   Pulmonary/Chest: Effort normal and breath sounds normal. She exhibits no mass, no tenderness, no deformity, no swelling and no retraction. Right breast exhibits no mass, no nipple discharge, no skin change and no tenderness. Left breast exhibits no mass, no nipple discharge, no skin change and no tenderness.  Musculoskeletal:       Right shoulder with decreased range of motion. She's able to extend to about 160 is uncomfortable. She is able to reach behind her back but is uncomfortable and takes her a while to get the weight back. She is able to lift up some off of her back compared her left arm is 1 week ago. She has a positive empty can test on the right. No tenderness or swelling or lesions over the shoulder itself.  Neurological: She is alert and oriented to person, place, and time.  Skin: Skin is warm and dry.  Psychiatric: She has a normal mood and affect. Her behavior is normal.          Assessment & Plan:  Breast pain - unclear etiology-she has had this complaint before. She is overdue for her mammograms we'll schedule her for a bilateral diagnostic mammogram. She feels she's wearing a  good fitting and supportive bra. I see no skin changes. She is on any hormone therapy.  Right shoulder pain - this likely rotator cuff injury. Recommend physical therapy. She declined and would like to try home treatment first. I gave her handout on some exercises to start doing home. We will also start Mobic. Anchors her to take with food and water and avoid if any GI irritation or upset. If she's not some better in the next 2-3 weeks with improved pain and mobility then please followup. Also consider she could have some bursitis as well.

## 2012-01-22 NOTE — Patient Instructions (Addendum)
If shoulder is not better in 2-3 weeks then please call the office and schedule a follow up with me.  We will schedule your mammogram

## 2012-01-30 ENCOUNTER — Other Ambulatory Visit: Payer: Self-pay | Admitting: Family Medicine

## 2012-02-18 ENCOUNTER — Ambulatory Visit
Admission: RE | Admit: 2012-02-18 | Discharge: 2012-02-18 | Disposition: A | Discharge status: Home / Self Care | Payer: Medicare Other | Source: Ambulatory Visit | Attending: Family Medicine | Admitting: Family Medicine

## 2012-02-18 DIAGNOSIS — N644 Mastodynia: Secondary | ICD-10-CM

## 2012-08-04 ENCOUNTER — Other Ambulatory Visit: Payer: Self-pay | Admitting: Family Medicine

## 2012-11-05 ENCOUNTER — Other Ambulatory Visit: Payer: Self-pay | Admitting: Family Medicine

## 2013-01-27 ENCOUNTER — Ambulatory Visit (INDEPENDENT_AMBULATORY_CARE_PROVIDER_SITE_OTHER): Payer: Medicare Other | Admitting: Family Medicine

## 2013-01-27 ENCOUNTER — Encounter: Payer: Self-pay | Admitting: Family Medicine

## 2013-01-27 ENCOUNTER — Ambulatory Visit (INDEPENDENT_AMBULATORY_CARE_PROVIDER_SITE_OTHER): Payer: Medicare Other

## 2013-01-27 VITALS — BP 125/75 | HR 70 | Wt 212.0 lb

## 2013-01-27 DIAGNOSIS — M25569 Pain in unspecified knee: Secondary | ICD-10-CM

## 2013-01-27 DIAGNOSIS — M25562 Pain in left knee: Secondary | ICD-10-CM

## 2013-01-27 DIAGNOSIS — M1710 Unilateral primary osteoarthritis, unspecified knee: Secondary | ICD-10-CM | POA: Diagnosis not present

## 2013-01-27 DIAGNOSIS — I10 Essential (primary) hypertension: Secondary | ICD-10-CM | POA: Diagnosis not present

## 2013-01-27 DIAGNOSIS — M25469 Effusion, unspecified knee: Secondary | ICD-10-CM

## 2013-01-27 MED ORDER — BENAZEPRIL HCL 40 MG PO TABS: ORAL_TABLET | ORAL | Status: DC

## 2013-01-27 MED ORDER — MELOXICAM 15 MG PO TABS: 15.0000 mg | ORAL_TABLET | Freq: Every day | ORAL | Status: DC

## 2013-01-27 MED ORDER — TRAMADOL HCL 50 MG PO TABS: 50.0000 mg | ORAL_TABLET | Freq: Three times a day (TID) | ORAL | Status: DC | PRN

## 2013-01-27 MED ORDER — AMLODIPINE BESYLATE 10 MG PO TABS: ORAL_TABLET | ORAL | Status: DC

## 2013-01-27 MED ORDER — PRAVASTATIN SODIUM 40 MG PO TABS: ORAL_TABLET | ORAL | Status: DC

## 2013-01-27 NOTE — Progress Notes (Signed)
Subjective:    Patient ID: Rhonda Sampson, female    DOB: 16-Sep-1945, 67 y.o.   MRN: [id number]  HPI Left knee pain x 3  Weeks. Started swelling again.  Occ notices some popping. Sometimes gets really stif.  Walking make sit worse or sitting for a long time and then getting up.  Can' take NSAIDs due to GI upset. Responds really well to steroid injections.  Last xray was in 2011. Hard to walk up and down stairs. Right now she's not really taking anything for pain. She says she will occasionally take an Aleve but usually after 2 days it starts to really bother her stomach.  HTN-  Pt denies chest pain, SOB, dizziness, or heart palpitations.  Taking meds as directed w/o problems.  Denies medication side effects.    Review of Systems BP 125/75  Pulse 70  Wt 212 lb (96.163 kg)  BMI 36.37 kg/m2    Allergies  Allergen Reactions  . Nsaids     REACTION: GI intolerance.    No past medical history on file.  No past surgical history on file.  History   Social History  . Marital Status: Married    Spouse Name: N/A    Number of Children: N/A  . Years of Education: N/A   Occupational History  . Not on file.   Social History Main Topics  . Smoking status: Never Smoker   . Smokeless tobacco: Not on file  . Alcohol Use: Not on file  . Drug Use: Not on file  . Sexual Activity: Not on file   Other Topics Concern  . Not on file   Social History Narrative  . No narrative on file    No family history on file.  Outpatient Encounter Prescriptions as of 01/27/2013  Medication Sig Dispense Refill  . amLODipine (NORVASC) 10 MG tablet TAKE 1 TABLET BY MOUTH DAILY  90 tablet  0  . benazepril (LOTENSIN) 40 MG tablet TAKE 1 TABLET BY MOUTH DAILY  90 tablet  0  . pravastatin (PRAVACHOL) 40 MG tablet TAKE 1 TABLET BY MOUTH DAILY  90 tablet  0  . [DISCONTINUED] amLODipine (NORVASC) 10 MG tablet TAKE 1 TABLET BY MOUTH DAILY  90 tablet  0  . [DISCONTINUED] benazepril (LOTENSIN) 40 MG tablet TAKE  1 TABLET BY MOUTH DAILY  90 tablet  0  . [DISCONTINUED] meloxicam (MOBIC) 15 MG tablet Take 1 tablet (15 mg total) by mouth daily.  30 tablet  2  . [DISCONTINUED] meloxicam (MOBIC) 15 MG tablet Take 1 tablet (15 mg total) by mouth daily.  30 tablet  2  . [DISCONTINUED] pravastatin (PRAVACHOL) 40 MG tablet TAKE 1 TABLET BY MOUTH DAILY  90 tablet  0  . traMADol (ULTRAM) 50 MG tablet Take 1 tablet (50 mg total) by mouth every 8 (eight) hours as needed for pain.  45 tablet  0   No facility-administered encounter medications on file as of 01/27/2013.          Objective:   Physical Exam  Constitutional: She is oriented to person, place, and time. She appears well-developed and well-nourished.  HENT:  Head: Normocephalic and atraumatic.  Cardiovascular: Normal rate, regular rhythm and normal heart sounds.   Pulmonary/Chest: Effort normal and breath sounds normal.  Musculoskeletal:  Left knee with pain with full flexion.  Slight in laxity with ant drawer.  Hip, knee and ankle strength is 5/5 bilat. 1+ swelling and tender along the medial joint line and along the  lateral edge of the patella.   Neurological: She is alert and oriented to person, place, and time.  Skin: Skin is warm and dry.  Psychiatric: She has a normal mood and affect. Her behavior is normal.          Assessment & Plan:  Left knee pain secondary to OA.   Will order repeat xray today to see if OA has progresssed. Will call with results and schedule for injection with Dr. Karie Schwalbe. recommend Tylenol arthritis for daily relief for her knee pain. I also gave her prescription for tramadol to try as needed for more moderate to severe pain. Avoid NSAIDs.  HTN- well controlled. Continue current regimen. F/U in 6 months.

## 2013-01-29 ENCOUNTER — Ambulatory Visit (INDEPENDENT_AMBULATORY_CARE_PROVIDER_SITE_OTHER): Payer: Medicare Other | Admitting: Sports Medicine

## 2013-01-29 ENCOUNTER — Encounter: Payer: Self-pay | Admitting: Sports Medicine

## 2013-01-29 VITALS — BP 115/69 | HR 78 | Wt 215.0 lb

## 2013-01-29 DIAGNOSIS — IMO0002 Reserved for concepts with insufficient information to code with codable children: Secondary | ICD-10-CM | POA: Diagnosis not present

## 2013-01-29 DIAGNOSIS — M1710 Unilateral primary osteoarthritis, unspecified knee: Secondary | ICD-10-CM

## 2013-01-29 DIAGNOSIS — M1712 Unilateral primary osteoarthritis, left knee: Secondary | ICD-10-CM

## 2013-01-29 NOTE — Assessment & Plan Note (Signed)
With relatively preserved joint space on x-rays. Tylenol for the past 2 days has only been minimally effective. Injected under ultrasound guidance. Home exercises. Return in one month to see how things are going, we can always consider Visco supplementation if insufficient response to steroids.

## 2013-01-29 NOTE — Progress Notes (Signed)
   Subjective:    I'm seeing this patient as a consultation for: Dr. Linford Arnold   CC: Left knee pain  HPI: This is a very pleasant 67 year old female, she has a known history of left knee osteoarthritis, she's had pain for the past 25 years, has had a total of approximately 3 injections. For the past 3 months she has noted pain as well as swelling but she localizes predominantly along the medial joint line, radiating several inches down into the calf. Symptoms are moderate, persistent, no mechanical symptoms. She tried some over-the-counter Tylenol which was only minimally effective. She is here for consideration of interventional treatment.  Past medical history, Surgical history, Family history not pertinant except as noted below, Social history, Allergies, and medications have been entered into the medical record, reviewed, and no changes needed.   Review of Systems: No headache, visual changes, nausea, vomiting, diarrhea, constipation, dizziness, abdominal pain, skin rash, fevers, chills, night sweats, weight loss, swollen lymph nodes, body aches, joint swelling, muscle aches, chest pain, shortness of breath, mood changes, visual or auditory hallucinations.   Objective:   General: Well Developed, well nourished, and in no acute distress.  Neuro/Psych: Alert and oriented x3, extra-ocular muscles intact, able to move all 4 extremities, sensation grossly intact. Skin: Warm and dry, no rashes noted.  Respiratory: Not using accessory muscles, speaking in full sentences, trachea midline.  Cardiovascular: Pulses palpable, no extremity edema. Abdomen: Does not appear distended. Left Knee: No visible effusion, tender to palpation at the medial and lateral joint lines. ROM full in flexion and extension and lower leg rotation. Ligaments with solid consistent endpoints including ACL, PCL, LCL, MCL. Negative Mcmurray's, Apley's, and Thessalonian tests. Non painful patellar compression. Patellar glide  without crepitus. Patellar and quadriceps tendons unremarkable. Hamstring and quadriceps strength is normal.   X-rays were reviewed and show moderate DJD in all 3 compartments.  Procedure: Real-time Ultrasound Guided Injection of left knee Device: GE Logiq E  Verbal informed consent obtained.  Time-out conducted.  Noted no overlying erythema, induration, or other signs of local infection.  Skin prepped in a sterile fashion.  Local anesthesia: Topical Ethyl chloride.  With sterile technique and under real time ultrasound guidance:  2 cc Kenalog 40, 4 cc lidocaine injected easily into the suprapatellar recess. Completed without difficulty  Pain immediately resolved suggesting accurate placement of the medication.  Advised to call if fevers/chills, erythema, induration, drainage, or persistent bleeding.  Images permanently stored and available for review in the ultrasound unit.  Impression: Technically successful ultrasound guided injection.  Impression and Recommendations:   This case required medical decision making of moderate complexity.

## 2013-05-10 ENCOUNTER — Other Ambulatory Visit: Payer: Self-pay | Admitting: Family Medicine

## 2013-08-09 ENCOUNTER — Other Ambulatory Visit: Payer: Self-pay | Admitting: Family Medicine

## 2013-09-09 ENCOUNTER — Encounter: Payer: Self-pay | Admitting: Family Medicine

## 2013-09-09 ENCOUNTER — Ambulatory Visit (INDEPENDENT_AMBULATORY_CARE_PROVIDER_SITE_OTHER): Payer: Medicare Other | Admitting: Family Medicine

## 2013-09-09 VITALS — BP 110/65 | HR 77 | Ht 65.6 in | Wt 214.0 lb

## 2013-09-09 DIAGNOSIS — E785 Hyperlipidemia, unspecified: Secondary | ICD-10-CM | POA: Diagnosis not present

## 2013-09-09 DIAGNOSIS — I10 Essential (primary) hypertension: Secondary | ICD-10-CM | POA: Diagnosis not present

## 2013-09-09 DIAGNOSIS — E049 Nontoxic goiter, unspecified: Secondary | ICD-10-CM | POA: Diagnosis not present

## 2013-09-09 MED ORDER — BENAZEPRIL HCL 40 MG PO TABS: ORAL_TABLET | ORAL | Status: DC

## 2013-09-09 MED ORDER — AMBULATORY NON FORMULARY MEDICATION
Status: AC
Start: 2013-09-09 — End: ?

## 2013-09-09 MED ORDER — AMLODIPINE BESYLATE 10 MG PO TABS: 10.0000 mg | ORAL_TABLET | Freq: Every day | ORAL | Status: DC

## 2013-09-09 MED ORDER — PRAVASTATIN SODIUM 40 MG PO TABS: ORAL_TABLET | ORAL | Status: DC

## 2013-09-09 NOTE — Progress Notes (Signed)
   Subjective:    Patient ID: Rhonda Sampson, female    DOB: 03/14/1946, 68 y.o.   MRN: [id number]  HPI Hypertension- Pt denies chest pain, SOB, dizziness, or heart palpitations.  Taking meds as directed w/o problems.  Denies medication side effects.    Hyperlipidemia - Toleratin statin well with no S.effects.  Lab Results  Component Value Date   CHOL 174 04/17/2011   HDL 72 04/17/2011   LDLCALC 93 04/17/2011   LDLDIRECT 151* 09/02/2008   TRIG 44 04/17/2011   CHOLHDL 2.4 04/17/2011     Review of Systems     Objective:   Physical Exam  Constitutional: She is oriented to person, place, and time. She appears well-developed and well-nourished.  HENT:  Head: Normocephalic and atraumatic.  Neck: Thyromegaly present.  Cardiovascular: Normal rate, regular rhythm and normal heart sounds.   Pulmonary/Chest: Effort normal and breath sounds normal.  Lymphadenopathy:    She has no cervical adenopathy.  Neurological: She is alert and oriented to person, place, and time.  Skin: Skin is warm and dry.  Psychiatric: She has a normal mood and affect. Her behavior is normal.          Assessment & Plan:  HTN - well controlled.  Continue current regime. F/U in 6 months. Due for CMP  Hyperlpidemia - Due for lipids panel.    Thyromegaly-recheck TSH today.  Discussed need for shingles vaccine. Given prescription and additional handout information about it.

## 2013-09-14 DIAGNOSIS — E049 Nontoxic goiter, unspecified: Secondary | ICD-10-CM | POA: Diagnosis not present

## 2013-09-14 DIAGNOSIS — E785 Hyperlipidemia, unspecified: Secondary | ICD-10-CM | POA: Diagnosis not present

## 2013-09-14 DIAGNOSIS — I10 Essential (primary) hypertension: Secondary | ICD-10-CM | POA: Diagnosis not present

## 2013-09-15 LAB — COMPLETE METABOLIC PANEL WITH GFR
ALBUMIN: 3.9 g/dL (ref 3.5–5.2)
ALT: 12 U/L (ref 0–35)
AST: 17 U/L (ref 0–37)
Alkaline Phosphatase: 79 U/L (ref 39–117)
BUN: 16 mg/dL (ref 6–23)
CALCIUM: 9 mg/dL (ref 8.4–10.5)
CO2: 26 meq/L (ref 19–32)
Chloride: 105 mEq/L (ref 96–112)
Creat: 1.09 mg/dL (ref 0.50–1.10)
GFR, EST AFRICAN AMERICAN: 60 mL/min
GFR, Est Non African American: 52 mL/min — ABNORMAL LOW
Glucose, Bld: 86 mg/dL (ref 70–99)
Potassium: 4 mEq/L (ref 3.5–5.3)
Sodium: 138 mEq/L (ref 135–145)
TOTAL PROTEIN: 6.3 g/dL (ref 6.0–8.3)
Total Bilirubin: 0.8 mg/dL (ref 0.2–1.2)

## 2013-09-15 LAB — LIPID PANEL
CHOLESTEROL: 164 mg/dL (ref 0–200)
HDL: 65 mg/dL (ref >39)
LDL Cholesterol: 89 mg/dL (ref 0–99)
Total CHOL/HDL Ratio: 2.5 Ratio
Triglycerides: 49 mg/dL (ref <150)
VLDL: 10 mg/dL (ref 0–40)

## 2013-09-15 LAB — TSH: TSH: 0.475 u[IU]/mL (ref 0.350–4.500)

## 2013-09-16 NOTE — Progress Notes (Signed)
Quick Note:  All labs are normal. ______ 

## 2014-03-10 ENCOUNTER — Other Ambulatory Visit: Payer: Self-pay | Admitting: Family Medicine

## 2014-03-17 ENCOUNTER — Other Ambulatory Visit: Payer: Self-pay | Admitting: *Deleted

## 2014-03-17 MED ORDER — PRAVASTATIN SODIUM 40 MG PO TABS: ORAL_TABLET | ORAL | Status: DC

## 2014-03-17 MED ORDER — BENAZEPRIL HCL 40 MG PO TABS: ORAL_TABLET | ORAL | Status: DC

## 2014-04-13 ENCOUNTER — Other Ambulatory Visit: Payer: Self-pay | Admitting: Family Medicine

## 2014-04-15 ENCOUNTER — Ambulatory Visit (INDEPENDENT_AMBULATORY_CARE_PROVIDER_SITE_OTHER): Payer: Medicare Other | Admitting: Family Medicine

## 2014-04-15 ENCOUNTER — Encounter: Payer: Self-pay | Admitting: Family Medicine

## 2014-04-15 VITALS — BP 121/67 | HR 79 | Wt 220.0 lb

## 2014-04-15 DIAGNOSIS — E049 Nontoxic goiter, unspecified: Secondary | ICD-10-CM

## 2014-04-15 DIAGNOSIS — Z23 Encounter for immunization: Secondary | ICD-10-CM | POA: Diagnosis not present

## 2014-04-15 DIAGNOSIS — I10 Essential (primary) hypertension: Secondary | ICD-10-CM | POA: Diagnosis not present

## 2014-04-15 DIAGNOSIS — E010 Iodine-deficiency related diffuse (endemic) goiter: Secondary | ICD-10-CM

## 2014-04-15 LAB — BASIC METABOLIC PANEL
BUN: 15 mg/dL (ref 6–23)
CALCIUM: 8.9 mg/dL (ref 8.4–10.5)
CO2: 28 mEq/L (ref 19–32)
Chloride: 105 mEq/L (ref 96–112)
Creat: 1.02 mg/dL (ref 0.50–1.10)
Glucose, Bld: 69 mg/dL — ABNORMAL LOW (ref 70–99)
POTASSIUM: 4.2 meq/L (ref 3.5–5.3)
Sodium: 141 mEq/L (ref 135–145)

## 2014-04-15 LAB — TSH: TSH: 0.705 u[IU]/mL (ref 0.350–4.500)

## 2014-04-15 MED ORDER — PRAVASTATIN SODIUM 40 MG PO TABS: 40.0000 mg | ORAL_TABLET | Freq: Every day | ORAL | Status: DC

## 2014-04-15 MED ORDER — AMLODIPINE BESYLATE 10 MG PO TABS: 10.0000 mg | ORAL_TABLET | Freq: Every day | ORAL | Status: AC

## 2014-04-15 MED ORDER — BENAZEPRIL HCL 40 MG PO TABS: 40.0000 mg | ORAL_TABLET | Freq: Every day | ORAL | Status: DC

## 2014-04-15 NOTE — Progress Notes (Signed)
   Subjective:    Patient ID: Rhonda Sampson, female    DOB: 08/27/1945, 68 y.o.   MRN: [id number]  HPI Hypertension- Pt denies chest pain, SOB, dizziness, or heart palpitations.  Taking meds as directed w/o problems.  Denies medication side effects.  No swelling.   Thyromegaly-she thinks it may be getting a little bit larger. She denies any discomfort pain or difficulty swallowing. Review of Systems     Objective:   Physical Exam  Constitutional: She is oriented to person, place, and time. She appears well-developed and well-nourished.  HENT:  Head: Normocephalic and atraumatic.  Neck: Neck supple. Thyromegaly present.  No palpale nodules  Cardiovascular: Normal rate, regular rhythm and normal heart sounds.   Pulmonary/Chest: Effort normal and breath sounds normal.  Lymphadenopathy:    She has no cervical adenopathy.  Neurological: She is alert and oriented to person, place, and time.  Skin: Skin is warm and dry.  Psychiatric: She has a normal mood and affect. Her behavior is normal.          Assessment & Plan:  HTN- well controlled. Due for BMP.    Thyromegaly - I do feel like her thyroid is getting a little bit larger. She's actually never had an ultrasound. We will order that today as well as check a TSH. Next  She's willing to give the Prevnar 13 today.  Declined flu shot.

## 2014-04-15 NOTE — Addendum Note (Signed)
Addended by: Chalmers CaterUTTLE, Thomes Burak H on: 04/15/2014 11:31 AM   Modules accepted: Orders

## 2014-04-21 ENCOUNTER — Ambulatory Visit (INDEPENDENT_AMBULATORY_CARE_PROVIDER_SITE_OTHER): Payer: Medicare Other

## 2014-04-21 DIAGNOSIS — E042 Nontoxic multinodular goiter: Secondary | ICD-10-CM | POA: Diagnosis not present

## 2014-04-21 DIAGNOSIS — E010 Iodine-deficiency related diffuse (endemic) goiter: Secondary | ICD-10-CM

## 2014-06-10 ENCOUNTER — Other Ambulatory Visit: Payer: Self-pay | Admitting: Family Medicine

## 2014-11-03 ENCOUNTER — Other Ambulatory Visit: Payer: Self-pay | Admitting: Family Medicine

## 2014-11-10 ENCOUNTER — Other Ambulatory Visit: Payer: Self-pay | Admitting: Family Medicine

## 2014-12-10 ENCOUNTER — Other Ambulatory Visit: Payer: Self-pay | Admitting: Family Medicine

## 2015-04-02 IMAGING — CR DG KNEE 1-2V*L*
2 series · 2 of 2 positions shown · non-contrast
Comparison: 02/07/2010.

CLINICAL DATA: Knee pain for 10 years.

EXAM:
LEFT KNEE - 1-2 VIEW

[view not recorded (1 of 2)]
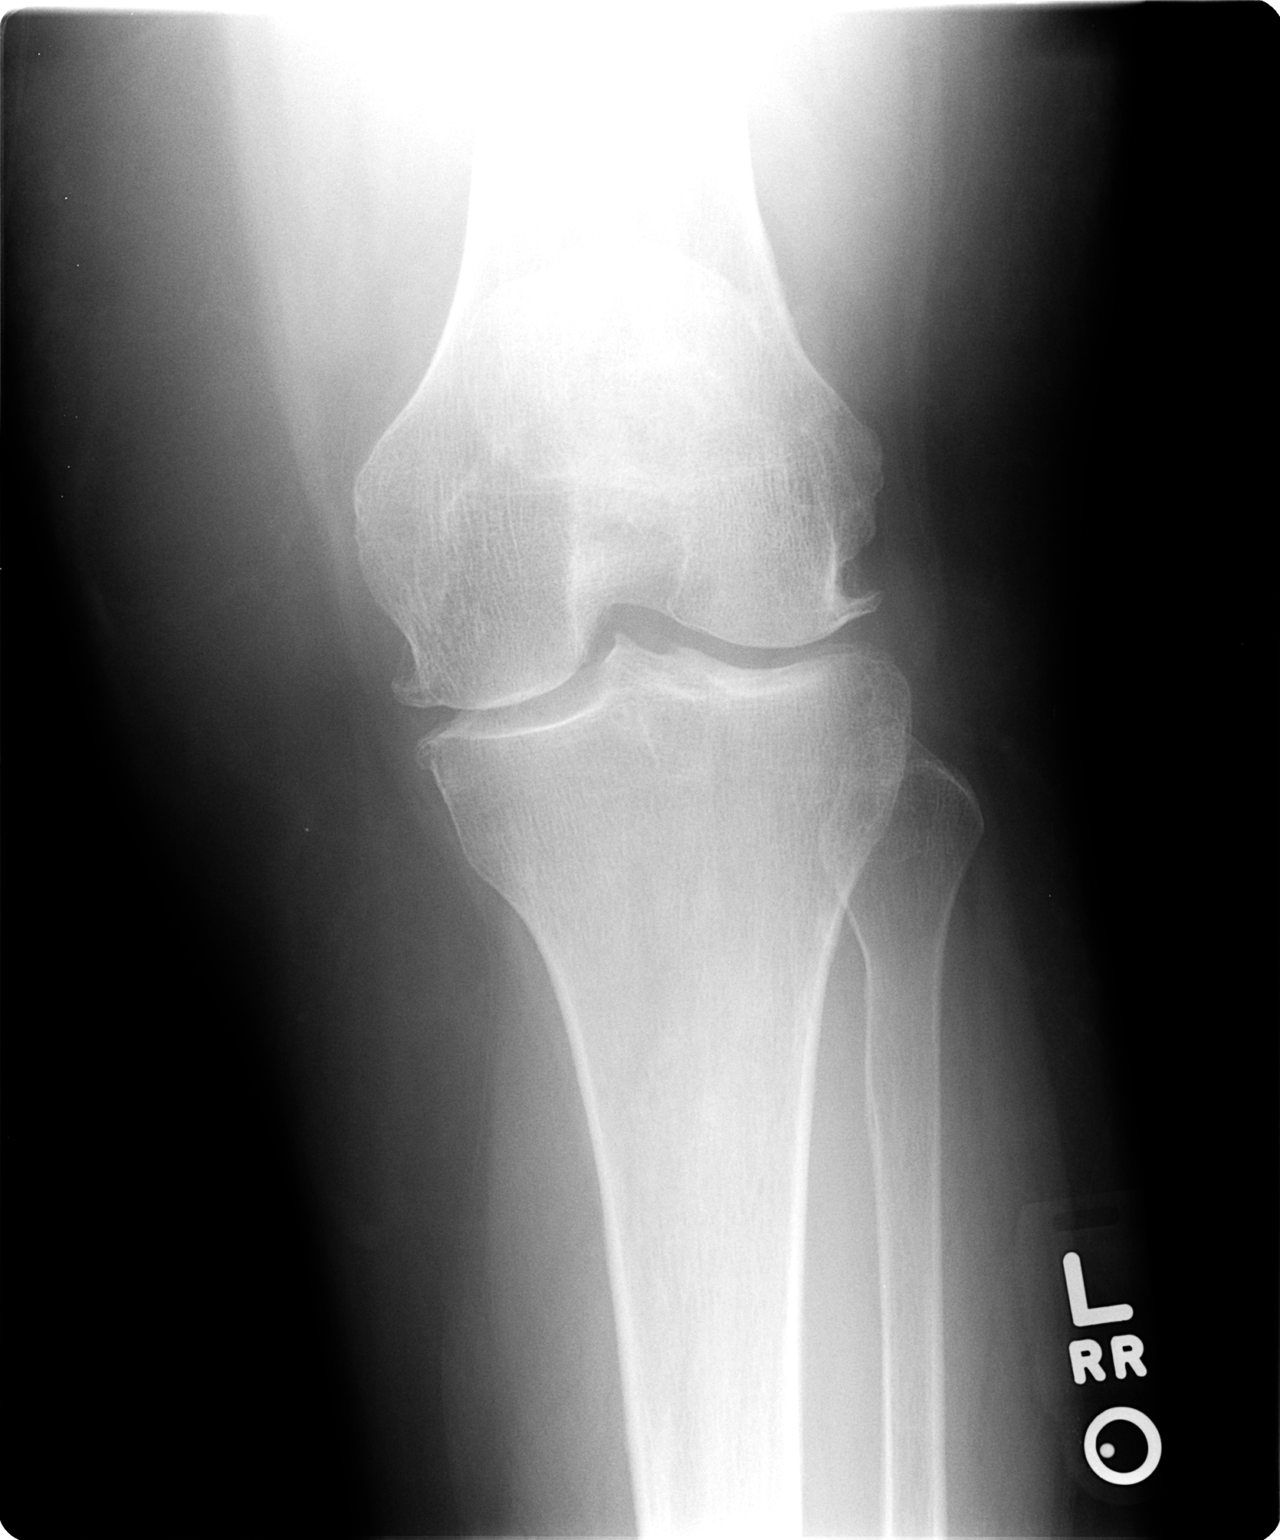

[view not recorded (2 of 2)]
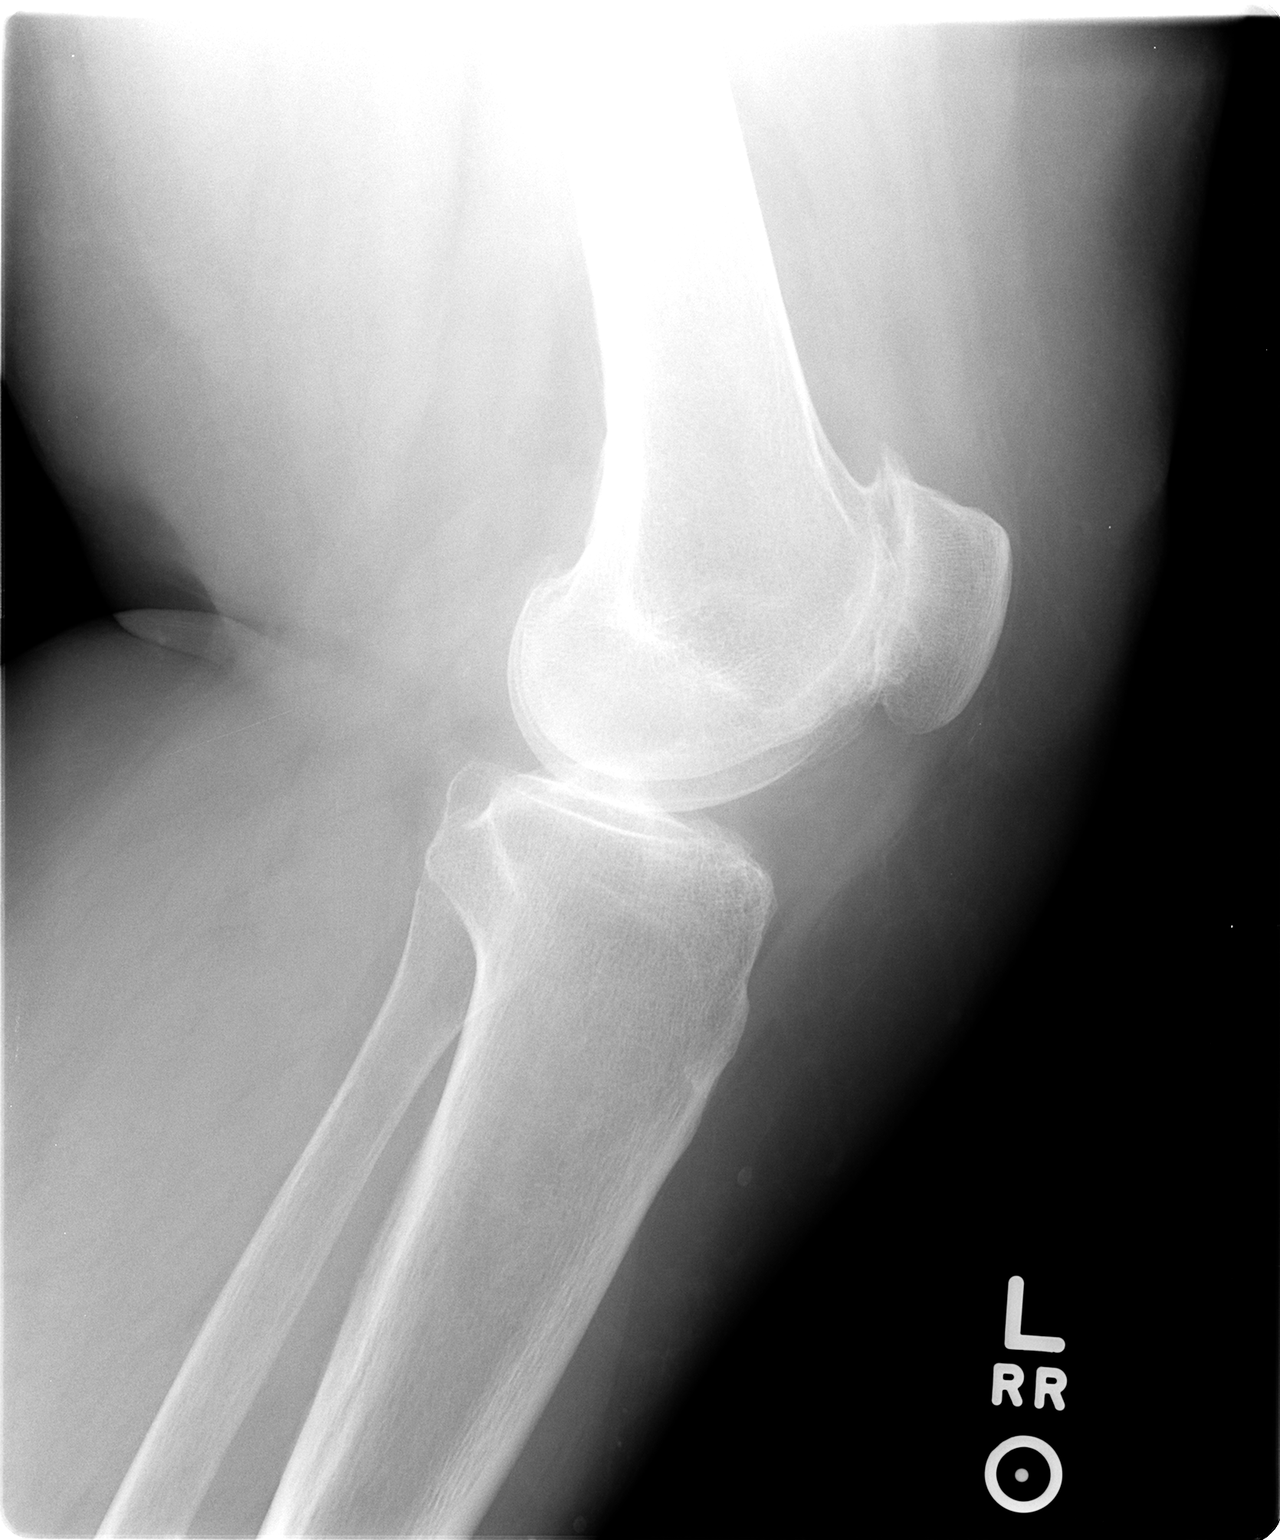

[2 of 2 positions shown; findings below may reference images not displayed]

FINDINGS: Tricompartment degenerative changes most notable patellofemoral
joint space. Small joint effusion.

No fracture or dislocation.
IMPRESSION: Tricompartment degenerative changes most notable patellofemoral
joint space. Small joint effusion.

## 2015-06-07 DIAGNOSIS — N644 Mastodynia: Secondary | ICD-10-CM | POA: Diagnosis not present

## 2015-06-07 DIAGNOSIS — R922 Inconclusive mammogram: Secondary | ICD-10-CM | POA: Diagnosis not present

## 2016-06-24 IMAGING — US US SOFT TISSUE HEAD/NECK
1 series · 14 of 25 positions shown · non-contrast
Comparison: None.

CLINICAL DATA: Enlarged palpable thyroid on physical exam.

EXAM:
THYROID ULTRASOUND
TECHNIQUE: Ultrasound examination of the thyroid gland and adjacent soft
tissues was performed.

[Series 1: us soft tissue head/neck · 0.09mm/px · 14 of 60 slices shown]
[im 1/60]
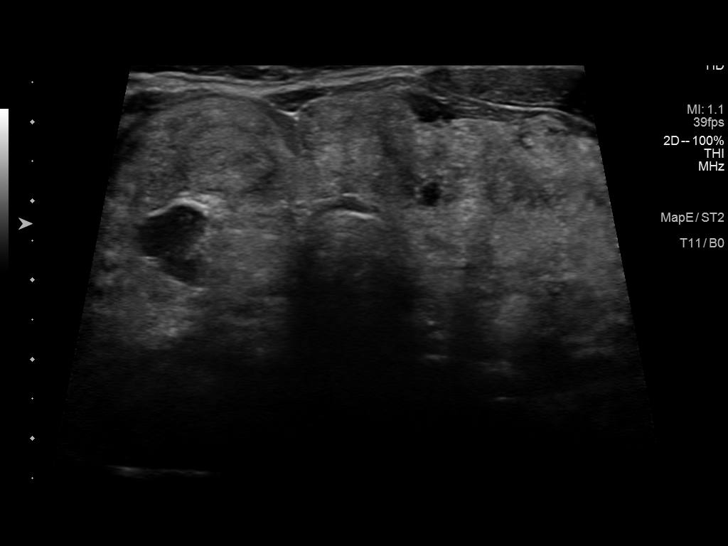
[im 5/60]
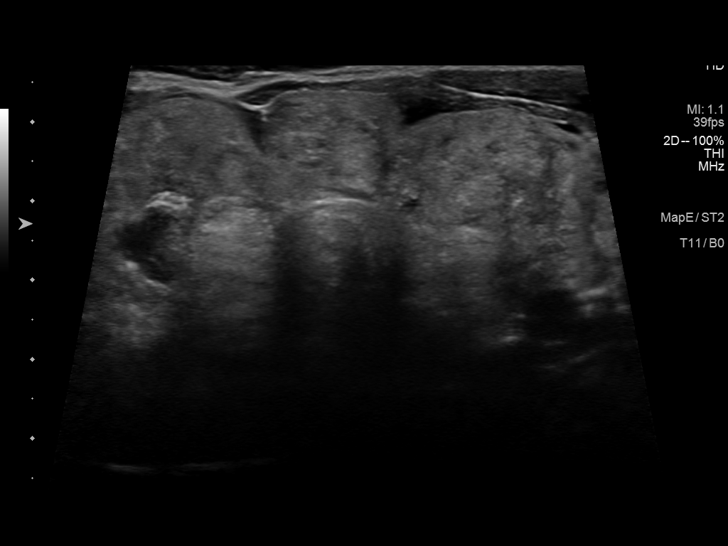
[im 10/60]
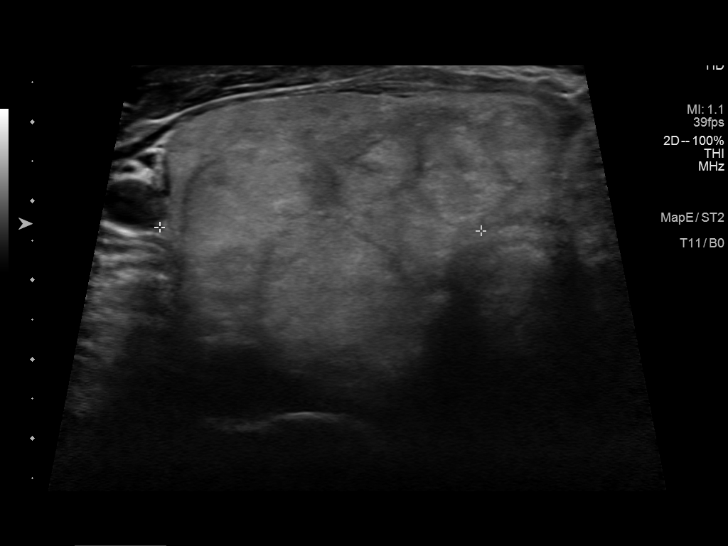
[im 15/60]
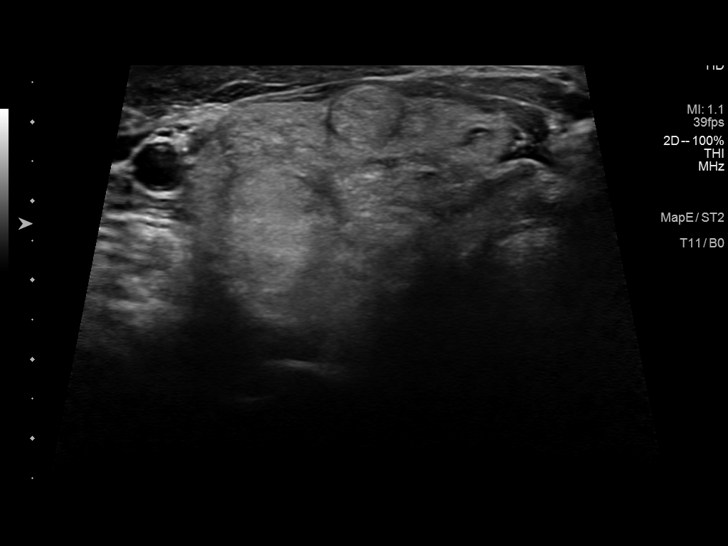
[im 20/60]
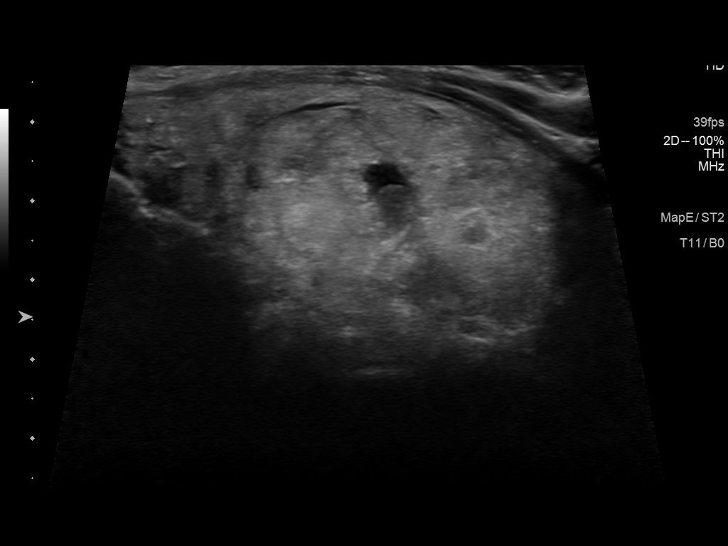
[im 23/60]
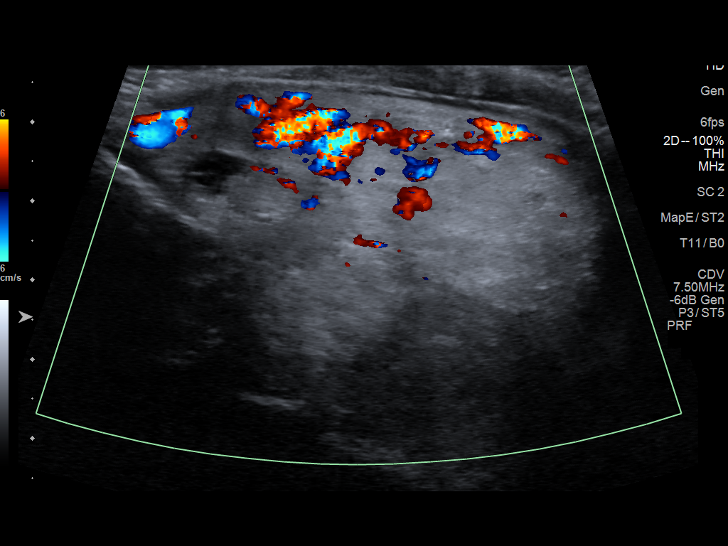
[im 28/60]
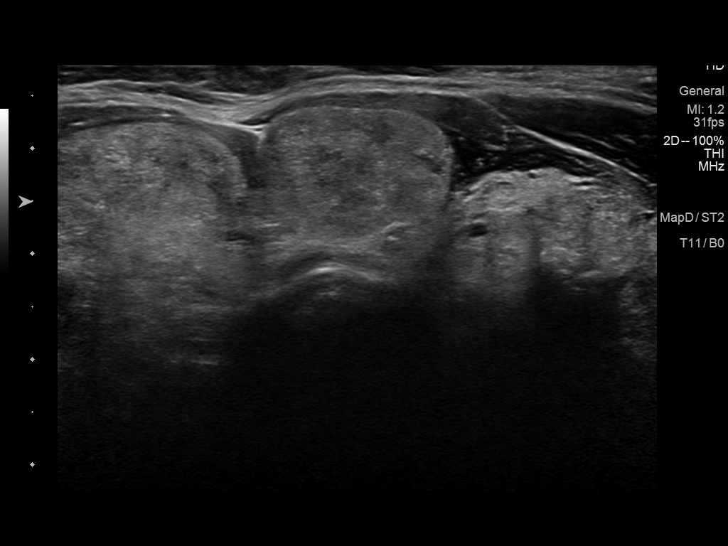
[im 32/60]
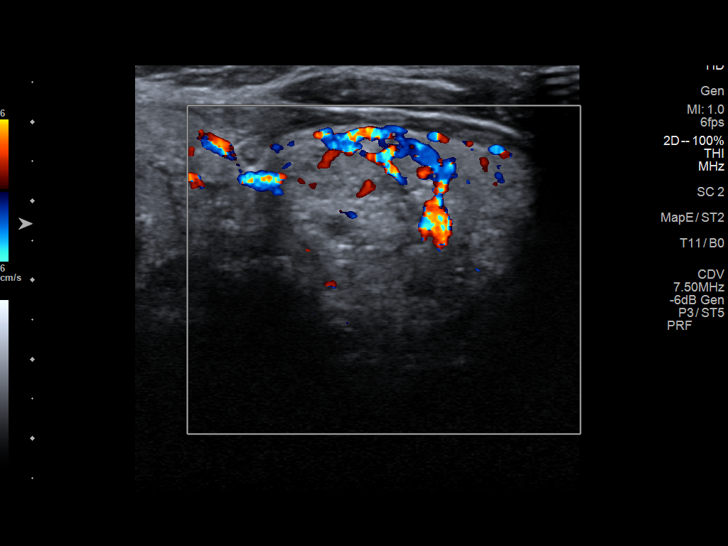
[im 37/60]
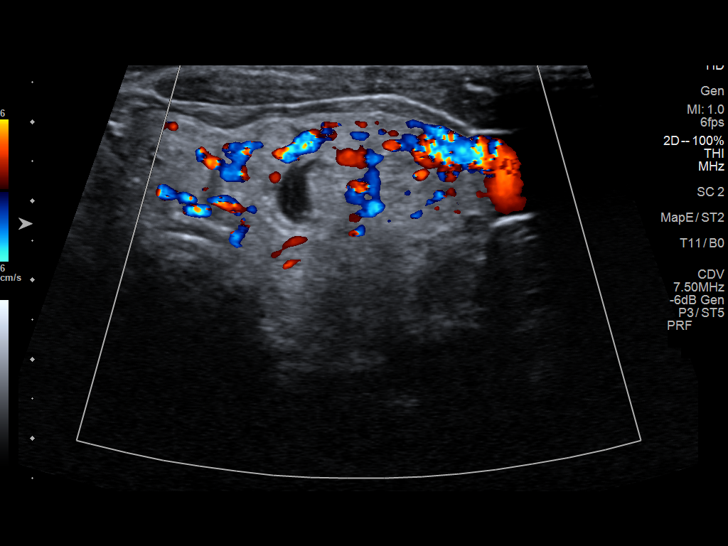
[im 40/60]
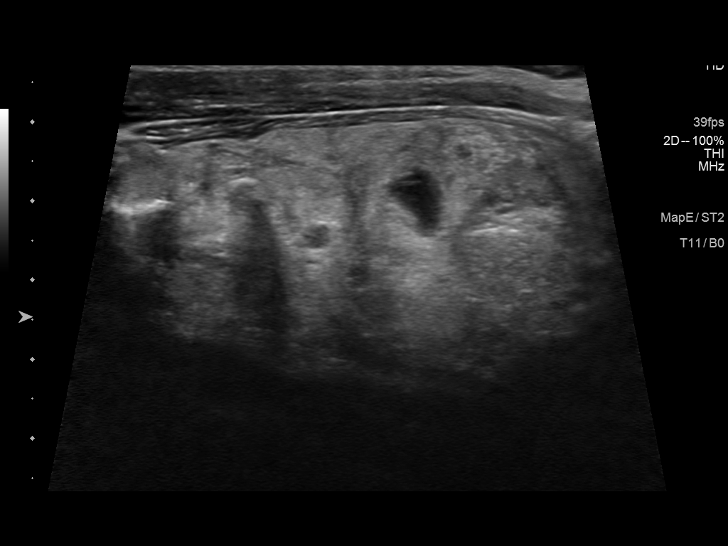
[im 45/60]
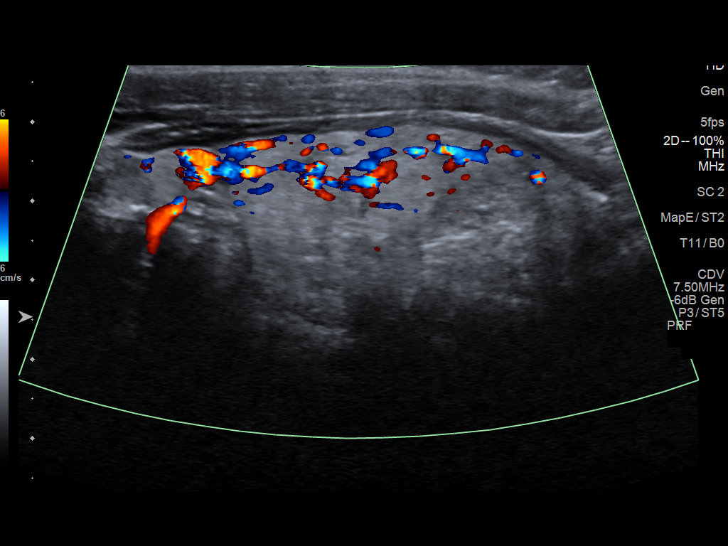
[im 50/60]
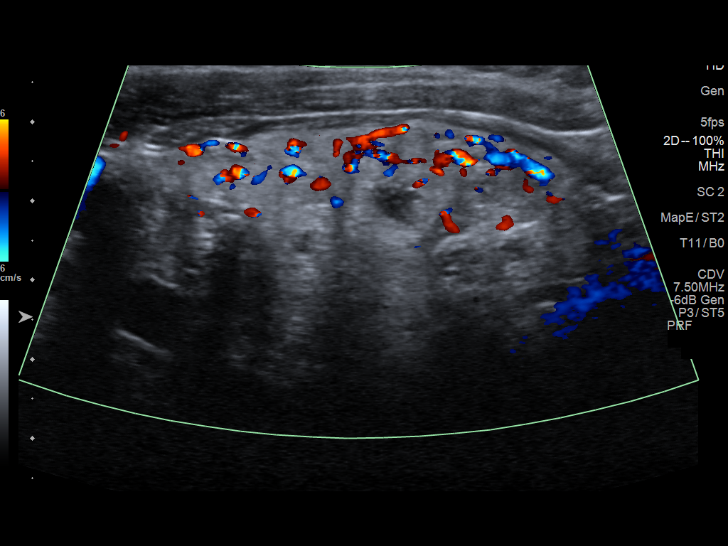
[im 55/60]
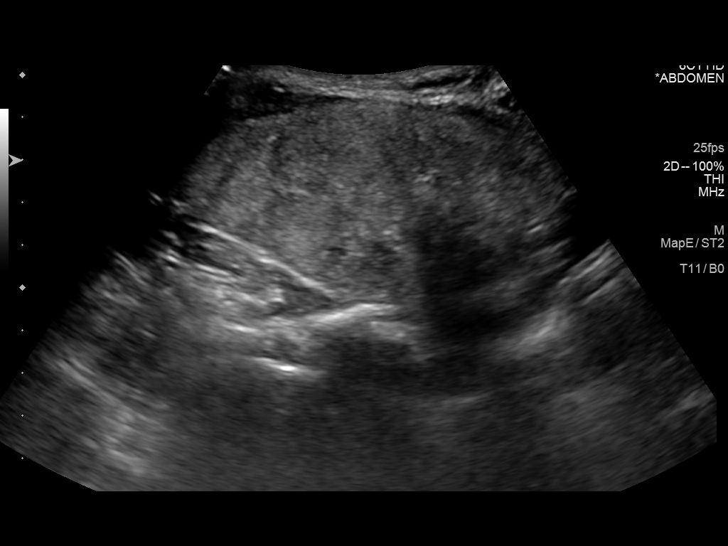
[im 60/60]
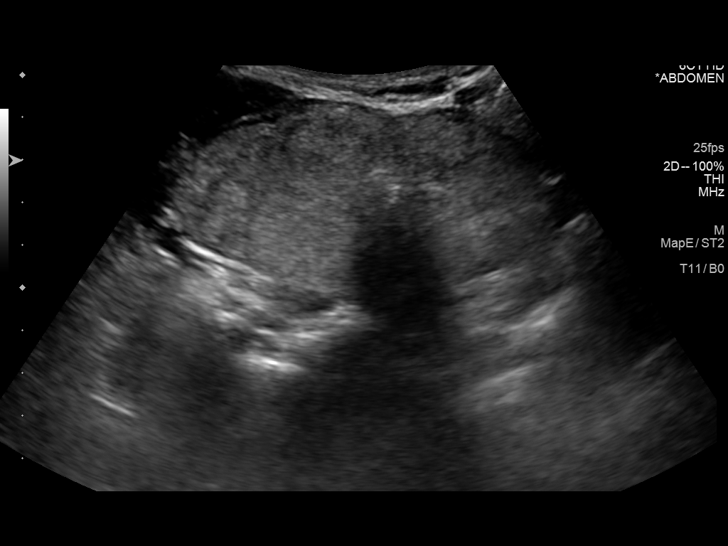

[14 of 25 positions shown; findings below may reference images not displayed]

FINDINGS: Right thyroid lobe

Measurements: 97 x 46 x 41 mm. Diffusely inhomogeneous nodular
echotexture without dominant lesion.

Left thyroid lobe

Measurements: 97 x 53 x 32 mm. Diffuse inhomogeneous nodular
echotexture without dominant mass.

Isthmus

Thickness: 15 mm.  Nodular enlargement without dominant mass.

Lymphadenopathy

None visualized.
IMPRESSION: 1. Marked thyromegaly with a nodular echotexture but no discrete
dominant mass or lesion. Findings do not meet current consensus
criteria for biopsy. Follow-up by clinical exam is recommended. If
patient has known risk factors for thyroid carcinoma, consider
follow-up ultrasound in 12 months. If patient is clinically
hyperthyroid, consider nuclear medicine thyroid uptake and scan.
This recommendation follows the consensus statement: Management of
Thyroid Nodules Detected as US: Society of Radiologists in
Ultrasound Consensus Conference Statement. Radiology 8999;

## 2021-05-25 DIAGNOSIS — E042 Nontoxic multinodular goiter: Secondary | ICD-10-CM | POA: Diagnosis not present

## 2021-06-12 DIAGNOSIS — N1832 Chronic kidney disease, stage 3b: Secondary | ICD-10-CM | POA: Diagnosis not present

## 2021-06-12 DIAGNOSIS — I10 Essential (primary) hypertension: Secondary | ICD-10-CM | POA: Diagnosis not present

## 2021-06-12 DIAGNOSIS — E7849 Other hyperlipidemia: Secondary | ICD-10-CM | POA: Diagnosis not present

## 2021-06-19 DIAGNOSIS — E042 Nontoxic multinodular goiter: Secondary | ICD-10-CM | POA: Diagnosis not present

## 2021-06-19 DIAGNOSIS — E0590 Thyrotoxicosis, unspecified without thyrotoxic crisis or storm: Secondary | ICD-10-CM | POA: Diagnosis not present

## 2021-07-04 DIAGNOSIS — E042 Nontoxic multinodular goiter: Secondary | ICD-10-CM | POA: Diagnosis not present

## 2021-07-04 DIAGNOSIS — E0590 Thyrotoxicosis, unspecified without thyrotoxic crisis or storm: Secondary | ICD-10-CM | POA: Diagnosis not present

## 2021-07-09 DIAGNOSIS — I129 Hypertensive chronic kidney disease with stage 1 through stage 4 chronic kidney disease, or unspecified chronic kidney disease: Secondary | ICD-10-CM | POA: Diagnosis not present

## 2021-07-09 DIAGNOSIS — E042 Nontoxic multinodular goiter: Secondary | ICD-10-CM | POA: Diagnosis not present

## 2021-07-09 DIAGNOSIS — M1712 Unilateral primary osteoarthritis, left knee: Secondary | ICD-10-CM | POA: Diagnosis not present

## 2021-07-09 DIAGNOSIS — Z8711 Personal history of peptic ulcer disease: Secondary | ICD-10-CM | POA: Diagnosis not present

## 2021-07-09 DIAGNOSIS — K219 Gastro-esophageal reflux disease without esophagitis: Secondary | ICD-10-CM | POA: Diagnosis not present

## 2021-07-09 DIAGNOSIS — Z79899 Other long term (current) drug therapy: Secondary | ICD-10-CM | POA: Diagnosis not present

## 2021-07-09 DIAGNOSIS — E785 Hyperlipidemia, unspecified: Secondary | ICD-10-CM | POA: Diagnosis not present

## 2021-07-09 DIAGNOSIS — K279 Peptic ulcer, site unspecified, unspecified as acute or chronic, without hemorrhage or perforation: Secondary | ICD-10-CM | POA: Diagnosis not present

## 2021-07-09 DIAGNOSIS — Z6832 Body mass index (BMI) 32.0-32.9, adult: Secondary | ICD-10-CM | POA: Diagnosis not present

## 2021-07-09 DIAGNOSIS — E669 Obesity, unspecified: Secondary | ICD-10-CM | POA: Diagnosis not present

## 2021-07-09 DIAGNOSIS — N1832 Chronic kidney disease, stage 3b: Secondary | ICD-10-CM | POA: Diagnosis not present

## 2021-07-09 DIAGNOSIS — I10 Essential (primary) hypertension: Secondary | ICD-10-CM | POA: Diagnosis not present

## 2021-07-09 DIAGNOSIS — Z888 Allergy status to other drugs, medicaments and biological substances status: Secondary | ICD-10-CM | POA: Diagnosis not present

## 2021-07-09 DIAGNOSIS — E041 Nontoxic single thyroid nodule: Secondary | ICD-10-CM | POA: Diagnosis not present

## 2021-07-10 DIAGNOSIS — E042 Nontoxic multinodular goiter: Secondary | ICD-10-CM | POA: Diagnosis not present

## 2021-07-20 DIAGNOSIS — I10 Essential (primary) hypertension: Secondary | ICD-10-CM | POA: Diagnosis not present

## 2021-07-20 DIAGNOSIS — G8929 Other chronic pain: Secondary | ICD-10-CM | POA: Diagnosis not present

## 2021-07-20 DIAGNOSIS — M25561 Pain in right knee: Secondary | ICD-10-CM | POA: Diagnosis not present

## 2021-07-25 DIAGNOSIS — M25561 Pain in right knee: Secondary | ICD-10-CM | POA: Diagnosis not present

## 2021-07-25 DIAGNOSIS — I10 Essential (primary) hypertension: Secondary | ICD-10-CM | POA: Diagnosis not present

## 2021-07-25 DIAGNOSIS — M1711 Unilateral primary osteoarthritis, right knee: Secondary | ICD-10-CM | POA: Diagnosis not present

## 2021-08-27 DIAGNOSIS — I10 Essential (primary) hypertension: Secondary | ICD-10-CM | POA: Diagnosis not present

## 2021-08-27 DIAGNOSIS — M5416 Radiculopathy, lumbar region: Secondary | ICD-10-CM | POA: Diagnosis not present

## 2021-08-27 DIAGNOSIS — M1711 Unilateral primary osteoarthritis, right knee: Secondary | ICD-10-CM | POA: Diagnosis not present

## 2021-08-31 DIAGNOSIS — H6123 Impacted cerumen, bilateral: Secondary | ICD-10-CM | POA: Diagnosis not present

## 2021-08-31 DIAGNOSIS — H903 Sensorineural hearing loss, bilateral: Secondary | ICD-10-CM | POA: Diagnosis not present

## 2021-09-06 DIAGNOSIS — I10 Essential (primary) hypertension: Secondary | ICD-10-CM | POA: Diagnosis not present

## 2021-09-06 DIAGNOSIS — E890 Postprocedural hypothyroidism: Secondary | ICD-10-CM | POA: Diagnosis not present

## 2021-09-06 DIAGNOSIS — E042 Nontoxic multinodular goiter: Secondary | ICD-10-CM | POA: Diagnosis not present

## 2021-10-15 DIAGNOSIS — Z20822 Contact with and (suspected) exposure to covid-19: Secondary | ICD-10-CM | POA: Diagnosis not present

## 2021-11-12 DIAGNOSIS — M1711 Unilateral primary osteoarthritis, right knee: Secondary | ICD-10-CM | POA: Diagnosis not present

## 2021-11-12 DIAGNOSIS — I10 Essential (primary) hypertension: Secondary | ICD-10-CM | POA: Diagnosis not present

## 2021-12-11 DIAGNOSIS — Z0000 Encounter for general adult medical examination without abnormal findings: Secondary | ICD-10-CM | POA: Diagnosis not present

## 2021-12-11 DIAGNOSIS — Z13820 Encounter for screening for osteoporosis: Secondary | ICD-10-CM | POA: Diagnosis not present

## 2021-12-11 DIAGNOSIS — E7849 Other hyperlipidemia: Secondary | ICD-10-CM | POA: Diagnosis not present

## 2021-12-11 DIAGNOSIS — I10 Essential (primary) hypertension: Secondary | ICD-10-CM | POA: Diagnosis not present

## 2021-12-11 DIAGNOSIS — R600 Localized edema: Secondary | ICD-10-CM | POA: Diagnosis not present

## 2021-12-24 DIAGNOSIS — Z1231 Encounter for screening mammogram for malignant neoplasm of breast: Secondary | ICD-10-CM | POA: Diagnosis not present

## 2021-12-28 DIAGNOSIS — M85852 Other specified disorders of bone density and structure, left thigh: Secondary | ICD-10-CM | POA: Diagnosis not present

## 2021-12-28 DIAGNOSIS — M8588 Other specified disorders of bone density and structure, other site: Secondary | ICD-10-CM | POA: Diagnosis not present

## 2021-12-28 DIAGNOSIS — Z13820 Encounter for screening for osteoporosis: Secondary | ICD-10-CM | POA: Diagnosis not present

## 2021-12-28 DIAGNOSIS — M8589 Other specified disorders of bone density and structure, multiple sites: Secondary | ICD-10-CM | POA: Diagnosis not present

## 2022-01-15 DIAGNOSIS — M7751 Other enthesopathy of right foot: Secondary | ICD-10-CM | POA: Diagnosis not present

## 2022-01-15 DIAGNOSIS — I10 Essential (primary) hypertension: Secondary | ICD-10-CM | POA: Diagnosis not present

## 2022-01-15 DIAGNOSIS — M25571 Pain in right ankle and joints of right foot: Secondary | ICD-10-CM | POA: Diagnosis not present

## 2022-01-15 DIAGNOSIS — M7741 Metatarsalgia, right foot: Secondary | ICD-10-CM | POA: Diagnosis not present

## 2022-03-07 DIAGNOSIS — Z90710 Acquired absence of both cervix and uterus: Secondary | ICD-10-CM | POA: Diagnosis not present

## 2022-03-07 DIAGNOSIS — I10 Essential (primary) hypertension: Secondary | ICD-10-CM | POA: Diagnosis not present

## 2022-03-07 DIAGNOSIS — Z886 Allergy status to analgesic agent status: Secondary | ICD-10-CM | POA: Diagnosis not present

## 2022-03-07 DIAGNOSIS — K219 Gastro-esophageal reflux disease without esophagitis: Secondary | ICD-10-CM | POA: Diagnosis not present

## 2022-03-07 DIAGNOSIS — K5730 Diverticulosis of large intestine without perforation or abscess without bleeding: Secondary | ICD-10-CM | POA: Diagnosis not present

## 2022-03-07 DIAGNOSIS — R1012 Left upper quadrant pain: Secondary | ICD-10-CM | POA: Diagnosis not present

## 2022-03-07 DIAGNOSIS — Z79899 Other long term (current) drug therapy: Secondary | ICD-10-CM | POA: Diagnosis not present

## 2022-03-07 DIAGNOSIS — R109 Unspecified abdominal pain: Secondary | ICD-10-CM | POA: Diagnosis not present

## 2022-03-07 DIAGNOSIS — Z7983 Long term (current) use of bisphosphonates: Secondary | ICD-10-CM | POA: Diagnosis not present

## 2022-03-07 DIAGNOSIS — R1013 Epigastric pain: Secondary | ICD-10-CM | POA: Diagnosis not present

## 2022-03-07 DIAGNOSIS — I700 Atherosclerosis of aorta: Secondary | ICD-10-CM | POA: Diagnosis not present

## 2022-03-07 DIAGNOSIS — N3289 Other specified disorders of bladder: Secondary | ICD-10-CM | POA: Diagnosis not present

## 2022-03-11 DIAGNOSIS — I10 Essential (primary) hypertension: Secondary | ICD-10-CM | POA: Diagnosis not present

## 2022-03-11 DIAGNOSIS — E890 Postprocedural hypothyroidism: Secondary | ICD-10-CM | POA: Diagnosis not present

## 2022-03-11 DIAGNOSIS — E042 Nontoxic multinodular goiter: Secondary | ICD-10-CM | POA: Diagnosis not present

## 2022-03-15 DIAGNOSIS — R109 Unspecified abdominal pain: Secondary | ICD-10-CM | POA: Diagnosis not present

## 2022-03-15 DIAGNOSIS — I10 Essential (primary) hypertension: Secondary | ICD-10-CM | POA: Diagnosis not present

## 2022-03-15 DIAGNOSIS — M810 Age-related osteoporosis without current pathological fracture: Secondary | ICD-10-CM | POA: Diagnosis not present

## 2024-01-13 DIAGNOSIS — M1711 Unilateral primary osteoarthritis, right knee: Secondary | ICD-10-CM | POA: Diagnosis not present

## 2024-01-13 DIAGNOSIS — I10 Essential (primary) hypertension: Secondary | ICD-10-CM | POA: Diagnosis not present

## 2024-01-26 DIAGNOSIS — R92323 Mammographic fibroglandular density, bilateral breasts: Secondary | ICD-10-CM | POA: Diagnosis not present

## 2024-01-26 DIAGNOSIS — Z1231 Encounter for screening mammogram for malignant neoplasm of breast: Secondary | ICD-10-CM | POA: Diagnosis not present

## 2024-02-10 DIAGNOSIS — L259 Unspecified contact dermatitis, unspecified cause: Secondary | ICD-10-CM | POA: Diagnosis not present

## 2024-02-10 DIAGNOSIS — E7849 Other hyperlipidemia: Secondary | ICD-10-CM | POA: Diagnosis not present

## 2024-02-10 DIAGNOSIS — N1832 Chronic kidney disease, stage 3b: Secondary | ICD-10-CM | POA: Diagnosis not present

## 2024-02-10 DIAGNOSIS — M810 Age-related osteoporosis without current pathological fracture: Secondary | ICD-10-CM | POA: Diagnosis not present

## 2024-02-10 DIAGNOSIS — I10 Essential (primary) hypertension: Secondary | ICD-10-CM | POA: Diagnosis not present

## 2024-03-11 DIAGNOSIS — E042 Nontoxic multinodular goiter: Secondary | ICD-10-CM | POA: Diagnosis not present

## 2024-03-11 DIAGNOSIS — E890 Postprocedural hypothyroidism: Secondary | ICD-10-CM | POA: Diagnosis not present

## 2024-03-11 DIAGNOSIS — I10 Essential (primary) hypertension: Secondary | ICD-10-CM | POA: Diagnosis not present

## 2024-03-14 DIAGNOSIS — R059 Cough, unspecified: Secondary | ICD-10-CM | POA: Diagnosis not present

## 2024-03-14 DIAGNOSIS — U071 COVID-19: Secondary | ICD-10-CM | POA: Diagnosis not present
